# Patient Record
Sex: Male | Born: 1960 | Race: White | Hispanic: No | Marital: Married | Smoking: Never smoker
Health system: Southern US, Community
[De-identification: ages and names within clinical notes are randomized; demographics above are authoritative.]

## PROBLEM LIST (undated history)

## (undated) DIAGNOSIS — M810 Age-related osteoporosis without current pathological fracture: Secondary | ICD-10-CM

---

## 2000-10-10 ENCOUNTER — Encounter: Payer: Self-pay | Admitting: Emergency Medicine

## 2000-10-10 ENCOUNTER — Emergency Department (HOSPITAL_COMMUNITY): Admission: EM | Admit: 2000-10-10 | Discharge: 2000-10-10 | Payer: Self-pay | Admitting: Emergency Medicine

## 2004-05-23 ENCOUNTER — Emergency Department (HOSPITAL_COMMUNITY): Admission: EM | Admit: 2004-05-23 | Discharge: 2004-05-23 | Payer: Self-pay | Admitting: Emergency Medicine

## 2012-06-02 ENCOUNTER — Encounter (HOSPITAL_COMMUNITY): Payer: Self-pay | Admitting: Certified Registered Nurse Anesthetist

## 2012-06-02 ENCOUNTER — Emergency Department (HOSPITAL_COMMUNITY): Payer: Managed Care, Other (non HMO)

## 2012-06-02 ENCOUNTER — Inpatient Hospital Stay (HOSPITAL_COMMUNITY): Payer: Managed Care, Other (non HMO)

## 2012-06-02 ENCOUNTER — Inpatient Hospital Stay (HOSPITAL_COMMUNITY)
Admission: EM | Admit: 2012-06-02 | Discharge: 2012-06-04 | DRG: 493 | Disposition: A | Discharge status: Home / Self Care | Payer: Managed Care, Other (non HMO) | Source: Ambulatory Visit | Attending: Orthopedic Surgery | Admitting: Orthopedic Surgery

## 2012-06-02 ENCOUNTER — Encounter (HOSPITAL_COMMUNITY)
Admission: EM | Disposition: A | Discharge status: Home / Self Care | Payer: Self-pay | Source: Ambulatory Visit | Attending: Orthopedic Surgery

## 2012-06-02 ENCOUNTER — Emergency Department (HOSPITAL_COMMUNITY): Payer: Managed Care, Other (non HMO) | Admitting: Certified Registered Nurse Anesthetist

## 2012-06-02 ENCOUNTER — Encounter (HOSPITAL_COMMUNITY): Payer: Self-pay | Admitting: Cardiology

## 2012-06-02 DIAGNOSIS — S82209A Unspecified fracture of shaft of unspecified tibia, initial encounter for closed fracture: Secondary | ICD-10-CM | POA: Diagnosis present

## 2012-06-02 DIAGNOSIS — W010XXA Fall on same level from slipping, tripping and stumbling without subsequent striking against object, initial encounter: Secondary | ICD-10-CM | POA: Diagnosis present

## 2012-06-02 DIAGNOSIS — E1169 Type 2 diabetes mellitus with other specified complication: Secondary | ICD-10-CM | POA: Diagnosis present

## 2012-06-02 DIAGNOSIS — R55 Syncope and collapse: Secondary | ICD-10-CM | POA: Diagnosis present

## 2012-06-02 DIAGNOSIS — Z794 Long term (current) use of insulin: Secondary | ICD-10-CM

## 2012-06-02 DIAGNOSIS — S82409A Unspecified fracture of shaft of unspecified fibula, initial encounter for closed fracture: Secondary | ICD-10-CM

## 2012-06-02 DIAGNOSIS — S8253XA Displaced fracture of medial malleolus of unspecified tibia, initial encounter for closed fracture: Principal | ICD-10-CM | POA: Diagnosis present

## 2012-06-02 HISTORY — PX: TIBIA IM NAIL INSERTION: SHX2516

## 2012-06-02 LAB — POCT I-STAT, CHEM 8
Glucose, Bld: 156 mg/dL — ABNORMAL HIGH (ref 70–99)
HCT: 43 % (ref 39.0–52.0)
Hemoglobin: 14.6 g/dL (ref 13.0–17.0)
Potassium: 5.3 mEq/L — ABNORMAL HIGH (ref 3.5–5.1)

## 2012-06-02 LAB — CBC
MCV: 92.8 fL (ref 78.0–100.0)
Platelets: 223 10*3/uL (ref 150–400)
RDW: 14.1 % (ref 11.5–15.5)
WBC: 17.8 10*3/uL — ABNORMAL HIGH (ref 4.0–10.5)

## 2012-06-02 LAB — BASIC METABOLIC PANEL
Calcium: 9.4 mg/dL (ref 8.4–10.5)
Chloride: 100 mEq/L (ref 96–112)
Creatinine, Ser: 0.95 mg/dL (ref 0.50–1.35)
GFR calc Af Amer: >90 mL/min (ref >90)

## 2012-06-02 LAB — GLUCOSE, CAPILLARY
Glucose-Capillary: 125 mg/dL — ABNORMAL HIGH (ref 70–99)
Glucose-Capillary: 147 mg/dL — ABNORMAL HIGH (ref 70–99)
Glucose-Capillary: 204 mg/dL — ABNORMAL HIGH (ref 70–99)

## 2012-06-02 SURGERY — INSERTION, INTRAMEDULLARY ROD, TIBIA
Anesthesia: General | Site: Leg Lower | Laterality: Right | Wound class: Clean

## 2012-06-02 MED ORDER — CEFAZOLIN SODIUM 1-5 GM-% IV SOLN
1.0000 g | Freq: Four times a day (QID) | INTRAVENOUS | Status: AC
  Administered 2012-06-02 – 2012-06-03 (×3): 1 g via INTRAVENOUS
  Filled 2012-06-02 (×3): qty 50

## 2012-06-02 MED ORDER — OXYCODONE HCL 5 MG PO TABS
10.0000 mg | ORAL_TABLET | ORAL | Status: DC | PRN
  Administered 2012-06-03 – 2012-06-04 (×3): 10 mg via ORAL
  Filled 2012-06-02 (×3): qty 2

## 2012-06-02 MED ORDER — ZOLPIDEM TARTRATE 5 MG PO TABS: 5.0000 mg | ORAL_TABLET | Freq: Every evening | ORAL | Status: DC | PRN

## 2012-06-02 MED ORDER — ONDANSETRON HCL 4 MG PO TABS: 4.0000 mg | ORAL_TABLET | Freq: Four times a day (QID) | ORAL | Status: DC | PRN

## 2012-06-02 MED ORDER — LIDOCAINE HCL (CARDIAC) 20 MG/ML IV SOLN
INTRAVENOUS | Status: DC | PRN
  Administered 2012-06-02: 80 mg via INTRAVENOUS

## 2012-06-02 MED ORDER — ENOXAPARIN SODIUM 40 MG/0.4ML ~~LOC~~ SOLN
40.0000 mg | SUBCUTANEOUS | Status: DC
  Administered 2012-06-03 – 2012-06-04 (×2): 40 mg via SUBCUTANEOUS
  Filled 2012-06-02 (×3): qty 0.4

## 2012-06-02 MED ORDER — INSULIN ASPART 100 UNIT/ML ~~LOC~~ SOLN
0.0000 [IU] | SUBCUTANEOUS | Status: DC
  Administered 2012-06-02 – 2012-06-03 (×2): 2 [IU] via SUBCUTANEOUS

## 2012-06-02 MED ORDER — MIDAZOLAM HCL 5 MG/5ML IJ SOLN
INTRAMUSCULAR | Status: DC | PRN
  Administered 2012-06-02: 2 mg via INTRAVENOUS

## 2012-06-02 MED ORDER — INSULIN NPH (HUMAN) (ISOPHANE) 100 UNIT/ML ~~LOC~~ SUSP
12.0000 [IU] | Freq: Every day | SUBCUTANEOUS | Status: DC
  Administered 2012-06-03: 12 [IU] via SUBCUTANEOUS
  Filled 2012-06-02: qty 10

## 2012-06-02 MED ORDER — INSULIN ASPART 100 UNIT/ML ~~LOC~~ SOLN
5.0000 [IU] | Freq: Two times a day (BID) | SUBCUTANEOUS | Status: DC
Start: 2012-06-03 — End: 2012-06-03
  Administered 2012-06-03: 5 [IU] via SUBCUTANEOUS

## 2012-06-02 MED ORDER — MORPHINE SULFATE 4 MG/ML IJ SOLN
4.0000 mg | Freq: Once | INTRAMUSCULAR | Status: AC
  Administered 2012-06-02: 4 mg via INTRAVENOUS
  Filled 2012-06-02: qty 1

## 2012-06-02 MED ORDER — FENTANYL CITRATE 0.05 MG/ML IJ SOLN
INTRAMUSCULAR | Status: DC | PRN
  Administered 2012-06-02 (×2): 100 ug via INTRAVENOUS
  Administered 2012-06-02 (×3): 50 ug via INTRAVENOUS

## 2012-06-02 MED ORDER — NEOSTIGMINE METHYLSULFATE 1 MG/ML IJ SOLN
INTRAMUSCULAR | Status: DC | PRN
  Administered 2012-06-02: 5 mg via INTRAVENOUS

## 2012-06-02 MED ORDER — FLEET ENEMA 7-19 GM/118ML RE ENEM: 1.0000 | ENEMA | Freq: Once | RECTAL | Status: AC | PRN

## 2012-06-02 MED ORDER — GLYCOPYRROLATE 0.2 MG/ML IJ SOLN
INTRAMUSCULAR | Status: DC | PRN
  Administered 2012-06-02: .8 mg via INTRAVENOUS

## 2012-06-02 MED ORDER — CEFAZOLIN SODIUM-DEXTROSE 2-3 GM-% IV SOLR: 2.0000 g | Freq: Once | INTRAVENOUS | Status: DC

## 2012-06-02 MED ORDER — 0.9 % SODIUM CHLORIDE (POUR BTL) OPTIME
TOPICAL | Status: DC | PRN
  Administered 2012-06-02: 1000 mL

## 2012-06-02 MED ORDER — METHOCARBAMOL 500 MG PO TABS
500.0000 mg | ORAL_TABLET | Freq: Four times a day (QID) | ORAL | Status: DC | PRN
  Administered 2012-06-02 – 2012-06-04 (×3): 500 mg via ORAL
  Filled 2012-06-02 (×3): qty 1

## 2012-06-02 MED ORDER — METHOCARBAMOL 100 MG/ML IJ SOLN
500.0000 mg | Freq: Four times a day (QID) | INTRAVENOUS | Status: DC | PRN
  Filled 2012-06-02: qty 5

## 2012-06-02 MED ORDER — METOCLOPRAMIDE HCL 5 MG/ML IJ SOLN
5.0000 mg | Freq: Three times a day (TID) | INTRAMUSCULAR | Status: DC | PRN
Start: 2012-06-02 — End: 2012-06-04

## 2012-06-02 MED ORDER — BUPIVACAINE HCL (PF) 0.25 % IJ SOLN
INTRAMUSCULAR | Status: AC
  Filled 2012-06-02: qty 30

## 2012-06-02 MED ORDER — BUPIVACAINE HCL 0.25 % IJ SOLN
INTRAMUSCULAR | Status: DC | PRN
  Administered 2012-06-02: 10 mL

## 2012-06-02 MED ORDER — TETANUS-DIPHTH-ACELL PERTUSSIS 5-2.5-18.5 LF-MCG/0.5 IM SUSP
0.5000 mL | Freq: Once | INTRAMUSCULAR | Status: AC
  Administered 2012-06-02: 0.5 mL via INTRAMUSCULAR
  Filled 2012-06-02: qty 0.5

## 2012-06-02 MED ORDER — ONDANSETRON HCL 4 MG/2ML IJ SOLN: 4.0000 mg | Freq: Four times a day (QID) | INTRAMUSCULAR | Status: DC | PRN

## 2012-06-02 MED ORDER — ONDANSETRON HCL 4 MG/2ML IJ SOLN
4.0000 mg | INTRAMUSCULAR | Status: DC | PRN
  Administered 2012-06-02: 4 mg via INTRAVENOUS
  Filled 2012-06-02: qty 2

## 2012-06-02 MED ORDER — METOCLOPRAMIDE HCL 5 MG PO TABS
5.0000 mg | ORAL_TABLET | Freq: Three times a day (TID) | ORAL | Status: DC | PRN
Start: 2012-06-02 — End: 2012-06-04
  Filled 2012-06-02: qty 2

## 2012-06-02 MED ORDER — ACETAMINOPHEN 10 MG/ML IV SOLN
INTRAVENOUS | Status: DC | PRN
  Administered 2012-06-02: 1000 mg via INTRAVENOUS

## 2012-06-02 MED ORDER — FENTANYL CITRATE 0.05 MG/ML IJ SOLN: INTRAMUSCULAR | Status: DC | PRN

## 2012-06-02 MED ORDER — LACTATED RINGERS IV SOLN
INTRAVENOUS | Status: DC | PRN
  Administered 2012-06-02 (×3): via INTRAVENOUS

## 2012-06-02 MED ORDER — SUCCINYLCHOLINE CHLORIDE 20 MG/ML IJ SOLN
INTRAMUSCULAR | Status: DC | PRN
  Administered 2012-06-02: 120 mg via INTRAVENOUS

## 2012-06-02 MED ORDER — INSULIN REGULAR HUMAN 100 UNIT/ML IJ SOLN: 5.0000 [IU] | Freq: Two times a day (BID) | INTRAMUSCULAR | Status: DC

## 2012-06-02 MED ORDER — DEXTROSE 50 % IV SOLN
INTRAVENOUS | Status: AC
  Administered 2012-06-02: 13:00:00
  Filled 2012-06-02: qty 50

## 2012-06-02 MED ORDER — MORPHINE SULFATE 4 MG/ML IJ SOLN
INTRAMUSCULAR | Status: AC
  Filled 2012-06-02: qty 1

## 2012-06-02 MED ORDER — MORPHINE SULFATE 2 MG/ML IJ SOLN
1.0000 mg | INTRAMUSCULAR | Status: DC | PRN
  Administered 2012-06-02 – 2012-06-03 (×2): 1 mg via INTRAVENOUS
  Filled 2012-06-02 (×2): qty 1

## 2012-06-02 MED ORDER — ATORVASTATIN CALCIUM 10 MG PO TABS
10.0000 mg | ORAL_TABLET | Freq: Every day | ORAL | Status: DC
  Administered 2012-06-03 (×2): 10 mg via ORAL
  Filled 2012-06-02 (×4): qty 1

## 2012-06-02 MED ORDER — ONDANSETRON HCL 4 MG/2ML IJ SOLN
INTRAMUSCULAR | Status: DC | PRN
  Administered 2012-06-02 (×2): 4 mg via INTRAVENOUS

## 2012-06-02 MED ORDER — DOCUSATE SODIUM 100 MG PO CAPS
100.0000 mg | ORAL_CAPSULE | Freq: Two times a day (BID) | ORAL | Status: DC
  Administered 2012-06-03 – 2012-06-04 (×3): 100 mg via ORAL
  Filled 2012-06-02 (×5): qty 1

## 2012-06-02 MED ORDER — MORPHINE SULFATE 4 MG/ML IJ SOLN
4.0000 mg | Freq: Once | INTRAMUSCULAR | Status: AC
  Administered 2012-06-02: 4 mg via INTRAVENOUS

## 2012-06-02 MED ORDER — INSULIN NPH (HUMAN) (ISOPHANE) 100 UNIT/ML ~~LOC~~ SUSP
7.0000 [IU] | Freq: Every day | SUBCUTANEOUS | Status: DC
  Filled 2012-06-02: qty 10

## 2012-06-02 MED ORDER — ACETAMINOPHEN 10 MG/ML IV SOLN
1000.0000 mg | Freq: Four times a day (QID) | INTRAVENOUS | Status: AC
  Administered 2012-06-02 – 2012-06-03 (×4): 1000 mg via INTRAVENOUS
  Filled 2012-06-02 (×4): qty 100

## 2012-06-02 MED ORDER — HYDROMORPHONE HCL PF 1 MG/ML IJ SOLN
0.2500 mg | INTRAMUSCULAR | Status: DC | PRN
  Administered 2012-06-02 (×4): 0.5 mg via INTRAVENOUS

## 2012-06-02 MED ORDER — INSULIN NPH (HUMAN) (ISOPHANE) 100 UNIT/ML ~~LOC~~ SUSP: 7.0000 [IU] | Freq: Two times a day (BID) | SUBCUTANEOUS | Status: DC

## 2012-06-02 MED ORDER — HYDROMORPHONE HCL PF 1 MG/ML IJ SOLN
INTRAMUSCULAR | Status: AC
  Filled 2012-06-02: qty 1

## 2012-06-02 MED ORDER — PROPOFOL 10 MG/ML IV EMUL
INTRAVENOUS | Status: DC | PRN
  Administered 2012-06-02: 200 mg via INTRAVENOUS

## 2012-06-02 MED ORDER — ROCURONIUM BROMIDE 100 MG/10ML IV SOLN
INTRAVENOUS | Status: DC | PRN
  Administered 2012-06-02: 50 mg via INTRAVENOUS

## 2012-06-02 SURGICAL SUPPLY — 68 items
BANDAGE ELASTIC 4 VELCRO ST LF (GAUZE/BANDAGES/DRESSINGS) ×2 IMPLANT
BANDAGE ELASTIC 6 VELCRO ST LF (GAUZE/BANDAGES/DRESSINGS) ×2 IMPLANT
BANDAGE ESMARK 6X9 LF (GAUZE/BANDAGES/DRESSINGS) IMPLANT
BANDAGE GAUZE ELAST BULKY 4 IN (GAUZE/BANDAGES/DRESSINGS) ×2 IMPLANT
BIT DRILL 3.8X6 NS (BIT) ×2 IMPLANT
BIT DRILL 4.4 NS (BIT) ×2 IMPLANT
BNDG COHESIVE 4X5 TAN STRL (GAUZE/BANDAGES/DRESSINGS) ×2 IMPLANT
BNDG ESMARK 6X9 LF (GAUZE/BANDAGES/DRESSINGS)
CLOTH BEACON ORANGE TIMEOUT ST (SAFETY) ×2 IMPLANT
COVER SURGICAL LIGHT HANDLE (MISCELLANEOUS) ×2 IMPLANT
CUFF TOURNIQUET SINGLE 34IN LL (TOURNIQUET CUFF) IMPLANT
DRAPE C-ARM 42X72 X-RAY (DRAPES) ×2 IMPLANT
DRAPE EXTREMITY T 121X128X90 (DRAPE) ×2 IMPLANT
DRAPE INCISE IOBAN 66X45 STRL (DRAPES) ×2 IMPLANT
DRAPE ORTHO SPLIT 77X108 STRL (DRAPES) ×2
DRAPE PROXIMA HALF (DRAPES) ×4 IMPLANT
DRAPE SURG ORHT 6 SPLT 77X108 (DRAPES) ×2 IMPLANT
DRAPE U-SHAPE 47X51 STRL (DRAPES) ×2 IMPLANT
DRSG ADAPTIC 3X8 NADH LF (GAUZE/BANDAGES/DRESSINGS) ×2 IMPLANT
ELECT REM PT RETURN 9FT ADLT (ELECTROSURGICAL) ×2
ELECTRODE REM PT RTRN 9FT ADLT (ELECTROSURGICAL) ×1 IMPLANT
END CAP UNIVERSAL FLUSH (Trauma) ×2 IMPLANT
EVACUATOR 1/8 PVC DRAIN (DRAIN) IMPLANT
GLOVE BIO SURGEON STRL SZ 6.5 (GLOVE) ×4 IMPLANT
GLOVE BIOGEL PI IND STRL 6.5 (GLOVE) ×2 IMPLANT
GLOVE BIOGEL PI IND STRL 8.5 (GLOVE) ×1 IMPLANT
GLOVE BIOGEL PI INDICATOR 6.5 (GLOVE) ×2
GLOVE BIOGEL PI INDICATOR 8.5 (GLOVE) ×1
GLOVE ECLIPSE 6.0 STRL STRAW (GLOVE) ×4 IMPLANT
GLOVE ECLIPSE 8.5 STRL (GLOVE) ×2 IMPLANT
GOWN STRL NON-REIN LRG LVL3 (GOWN DISPOSABLE) ×4 IMPLANT
K-WIRE ACE 1.6X6 (WIRE) ×6
KIT BASIN OR (CUSTOM PROCEDURE TRAY) ×2 IMPLANT
KIT ROOM TURNOVER OR (KITS) ×2 IMPLANT
KWIRE ACE 1.6X6 (WIRE) ×3 IMPLANT
NAIL TIBIAL 9MMX34.5CM (Nail) ×2 IMPLANT
NEEDLE HYPO 25GX1X1/2 BEV (NEEDLE) ×2 IMPLANT
PACK ORTHO EXTREMITY (CUSTOM PROCEDURE TRAY) ×2 IMPLANT
PAD ARMBOARD 7.5X6 YLW CONV (MISCELLANEOUS) ×4 IMPLANT
PADDING CAST ABS 4INX4YD NS (CAST SUPPLIES) ×1
PADDING CAST ABS COTTON 4X4 ST (CAST SUPPLIES) ×1 IMPLANT
PIN GUIDE ACE (PIN) ×2 IMPLANT
SCREW ACE CAP BN (Screw) ×1 IMPLANT
SCREW ACECAP 38MM (Screw) ×2 IMPLANT
SCREW ACECAP 40MM (Screw) ×2 IMPLANT
SCREW BN OBLQ FT 54X4.5XST 2 (Screw) ×1 IMPLANT
SCREW CANN PART THRD 4.0X55 (Screw) ×2 IMPLANT
SCREW CORTICAL 5.5 35MM (Screw) ×2 IMPLANT
SCREW LAG  RD HEAD 4.0 48 LTH (Screw) ×2 IMPLANT
SCREW LAG  RD HEAD 4.0 50 LTH (Screw) ×1 IMPLANT
SCREW LAG RD HEAD 4.0 50 LTH (Screw) ×1 IMPLANT
SPLINT PLASTER CAST XFAST 5X30 (CAST SUPPLIES) ×1 IMPLANT
SPLINT PLASTER XFAST SET 5X30 (CAST SUPPLIES) ×1
SPONGE GAUZE 4X4 12PLY (GAUZE/BANDAGES/DRESSINGS) ×2 IMPLANT
STAPLER VISISTAT 35W (STAPLE) ×2 IMPLANT
STOCKINETTE TUBULAR 6 INCH (GAUZE/BANDAGES/DRESSINGS) ×2 IMPLANT
SUT PROLENE 3 0 PS 2 (SUTURE) IMPLANT
SUT VIC AB 0 CT1 27 (SUTURE)
SUT VIC AB 0 CT1 27XBRD ANBCTR (SUTURE) IMPLANT
SUT VIC AB 1 CT1 27 (SUTURE) ×1
SUT VIC AB 1 CT1 27XBRD ANBCTR (SUTURE) ×1 IMPLANT
SUT VIC AB 2-0 CT1 27 (SUTURE)
SUT VIC AB 2-0 CT1 TAPERPNT 27 (SUTURE) IMPLANT
SYR CONTROL 10ML LL (SYRINGE) ×2 IMPLANT
TOWEL OR 17X24 6PK STRL BLUE (TOWEL DISPOSABLE) ×2 IMPLANT
TOWEL OR 17X26 10 PK STRL BLUE (TOWEL DISPOSABLE) ×2 IMPLANT
TUBE CONNECTING 12X1/4 (SUCTIONS) ×2 IMPLANT
YANKAUER SUCT BULB TIP NO VENT (SUCTIONS) ×2 IMPLANT

## 2012-06-02 NOTE — Anesthesia Preprocedure Evaluation (Addendum)
Anesthesia Evaluation  Patient identified by MRN, date of birth, ID band Patient awake    Reviewed: Allergy & Precautions, H&P , NPO status , Patient's Chart, lab work & pertinent test results  Airway Mallampati: II TM Distance: >3 FB Neck ROM: Full    Dental  (+) Dental Advisory Given   Pulmonary neg pulmonary ROS,  breath sounds clear to auscultation        Cardiovascular negative cardio ROS  Rhythm:Regular Rate:Normal     Neuro/Psych    GI/Hepatic Neg liver ROS,   Endo/Other  Diabetes mellitus-, Well Controlled, Type 1, Insulin Dependent  Renal/GU negative Renal ROS     Musculoskeletal   Abdominal   Peds  Hematology   Anesthesia Other Findings   Reproductive/Obstetrics                         Anesthesia Physical Anesthesia Plan  ASA: III  Anesthesia Plan: General   Post-op Pain Management:    Induction: Intravenous  Airway Management Planned: Oral ETT  Additional Equipment:   Intra-op Plan:   Post-operative Plan: Extubation in OR  Informed Consent: I have reviewed the patients History and Physical, chart, labs and discussed the procedure including the risks, benefits and alternatives for the proposed anesthesia with the patient or authorized representative who has indicated his/her understanding and acceptance.     Plan Discussed with: CRNA, Surgeon and Anesthesiologist  Anesthesia Plan Comments:        Anesthesia Quick Evaluation

## 2012-06-02 NOTE — Transfer of Care (Signed)
Immediate Anesthesia Transfer of Care Note  Patient: Eddie Vang  Procedure(s) Performed: Procedure(s) (LRB): INTRAMEDULLARY (IM) NAIL TIBIAL (Right)  Patient Location: PACU  Anesthesia Type: General  Level of Consciousness: awake, oriented and patient cooperative  Airway & Oxygen Therapy: Patient Spontanous Breathing and Patient connected to face mask oxygen  Post-op Assessment: Report given to PACU RN, Post -op Vital signs reviewed and stable and Patient moving all extremities X 4  Post vital signs: Reviewed and stable  Complications: No apparent anesthesia complications

## 2012-06-02 NOTE — ED Provider Notes (Signed)
History     CSN: [id number]  Arrival date & time 06/02/12  1156   First MD Initiated Contact with Patient 06/02/12 1158      Chief Complaint  Patient presents with  . Ankle Injury    (Consider location/radiation/quality/duration/timing/severity/associated sxs/prior treatment) HPI Comments: Patient with a history of type 1 diabetes presents emergency department with a hypoglycemic syncopal event causing right tib-fib/ankle injury.  Incident occurred about 30-45 minutes ago and patient arrived via EMS. He was given one amp of D50 when his CBG was found to be 50.  Patient also given 250 fentanyl in route for pain.  Insulin dosing is 5 reg &12 nph AM,  7NPH at PM.  Patient denies hitting his head, vision change, headache, fever, night sweats, chills, palpitations, chest pain.  Tetanus status not up to date. .    Patient is a 51 y.o. male presenting with lower extremity injury. The history is provided by the patient.  Ankle Injury Associated symptoms include nausea. Pertinent negatives include no abdominal pain, chest pain, chills, congestion, diaphoresis, fever, headaches, numbness or weakness.    Past Medical History  Diagnosis Date  . Diabetes mellitus     History reviewed. No pertinent past surgical history.  History reviewed. No pertinent family history.  History  Substance Use Topics  . Smoking status: Not on file  . Smokeless tobacco: Not on file  . Alcohol Use:       Review of Systems  Constitutional: Negative for fever, chills, diaphoresis and appetite change.  HENT: Negative for congestion.   Eyes: Negative for visual disturbance.  Respiratory: Negative for shortness of breath.   Cardiovascular: Negative for chest pain and leg swelling.  Gastrointestinal: Positive for nausea. Negative for abdominal pain.  Genitourinary: Negative for dysuria, urgency and frequency.  Skin: Positive for color change, pallor and wound.  Neurological: Negative for dizziness, syncope,  weakness, light-headedness, numbness and headaches.  Psychiatric/Behavioral: Negative for confusion.  All other systems reviewed and are negative.    Allergies  Review of patient's allergies indicates no known allergies.  Home Medications   Current Outpatient Rx  Name Route Sig Dispense Refill  . ATORVASTATIN CALCIUM 10 MG PO TABS Oral Take 10 mg by mouth at bedtime.    . INSULIN ISOPHANE HUMAN 100 UNIT/ML Lambert SUSP Subcutaneous Inject 7-12 Units into the skin 2 (two) times daily before a meal. Inject 12 units in the morning and 7 units at night.    . INSULIN REGULAR HUMAN 100 UNIT/ML IJ SOLN Subcutaneous Inject 5 Units into the skin 2 (two) times daily before a meal.      BP 120/59  Pulse 70  Temp 97.6 F (36.4 C) (Oral)  Resp 18  SpO2 98%  Physical Exam  Nursing note and vitals reviewed. Constitutional: He is oriented to person, place, and time. He appears well-developed and well-nourished. No distress.       Normal Vitals  HENT:  Head: Normocephalic and atraumatic.  Eyes: Conjunctivae and EOM are normal.  Neck: Normal range of motion.  Cardiovascular:       Intact distal pulses  Pulmonary/Chest: Effort normal.  Musculoskeletal: Normal range of motion.       Right distal tib/fib with obvious deformity. No ttp over lateral malleolus. Pt able to move toes without difficulty.   Neurological: He is alert and oriented to person, place, and time.       Intact distal sensation  Skin: Skin is warm and dry. Abrasion and bruising noted. No  rash noted. He is not diaphoretic.       Bruising over medial malleolus, small abrasion over anterior shin, does not appear to be open, no laceration.   Psychiatric: He has a normal mood and affect. His behavior is normal.    ED Course  Procedures (including critical care time)  Labs Reviewed  GLUCOSE, CAPILLARY - Abnormal; Notable for the following:    Glucose-Capillary 138 (*)     All other components within normal limits  URINALYSIS,  ROUTINE W REFLEX MICROSCOPIC  CBC   Dg Tibia/fibula Right  06/02/2012  *RADIOLOGY REPORT*  Clinical Data: History of trauma from a fall.  RIGHT TIBIA AND FIBULA - 2 VIEW  Comparison: No priors.  Findings: There are comminuted fractures of the distal third of the tibial and fibular diaphyses.  Both fractures are angulated approximately 20 degrees posteriorly.  Fracture fragments are mildly distracted.  Overlying soft tissues are extremely swollen.  IMPRESSION: 1.  Comminuted distracted and angulated fractures of the distal third of the right tibial and fibular diaphyses with overlying soft tissue swelling, as above.  Original Report Authenticated By: Florencia Reasons, M.D.     No diagnosis found.  istat chm 8 w k+ of 5.3, ? Hemolysis will redraw.   MDM  Distal Tib/fib fractures w 20 degree angulation, closed but skin tenting present. Pain managed in ED. Last CBG 128. Orthopedics to see pt in ED and admit for surgery. Last meal at 10:00 AM (small bowl of cereal)  The patient appears reasonably stabilized for admission considering the current resources, flow, and capabilities available in the ED at this time, and I doubt any other [facility] requiring further screening and/or treatment in the ED prior to admission.         Jaci Carrel, [place] 06/02/12 1451

## 2012-06-02 NOTE — ED Notes (Signed)
Pt to department from home via EMS- reports that he was carrying som food and felt like his CBG was low. Stated that he tried to turn and lose his balance. Pt with obvious injury to the right ankle. CBG was initially 50 and given 1 amp of d50. Also given 250 of fent over the past hour. Bp- 126/70 Hr-65.

## 2012-06-02 NOTE — ED Notes (Signed)
Pt knows we need a urine sample but is unable to give one at this time

## 2012-06-02 NOTE — Preoperative (Signed)
Beta Blockers   Reason not to administer Beta Blockers:Not Applicable 

## 2012-06-02 NOTE — Brief Op Note (Signed)
06/02/2012  5:45 PM  PATIENT:  Eddie Vang  51 y.o. male  PRE-OPERATIVE DIAGNOSIS:  right tibia/fibula fracture  POST-OPERATIVE DIAGNOSIS:  right tibia/fibula fracture  PROCEDURE:  Procedure(s) (LRB): INTRAMEDULLARY (IM) NAIL TIBIAL (Right)  SURGEON:  Surgeon(s) and Role:    * Venita Lick, MD - Primary  PHYSICIAN ASSISTANT:   ASSISTANTS: Norval Gable   ANESTHESIA:   general  EBL:  Total I/O In: 2000 [I.V.:2000] Out: 150 [Blood:150]  BLOOD ADMINISTERED:none  DRAINS: none   LOCAL MEDICATIONS USED:  MARCAINE     SPECIMEN:  No Specimen  DISPOSITION OF SPECIMEN:  N/A  COUNTS:  YES  TOURNIQUET:  * No tourniquets in log *  DICTATION: .Other Dictation: Dictation Number [id number]  PLAN OF CARE: Admit to inpatient   PATIENT DISPOSITION:  PACU - hemodynamically stable.

## 2012-06-02 NOTE — Anesthesia Procedure Notes (Signed)
Procedure Name: Intubation Date/Time: 06/02/2012 3:32 PM Performed by: Sherie Don Pre-anesthesia Checklist: Patient identified, Emergency Drugs available, Suction available, Patient being monitored and Timeout performed Patient Re-evaluated:Patient Re-evaluated prior to inductionOxygen Delivery Method: Circle system utilized Preoxygenation: Pre-oxygenation with 100% oxygen Intubation Type: Cricoid Pressure applied and Rapid sequence Laryngoscope Size: Mac and 3 Grade View: Grade II Tube type: Oral Tube size: 7.5 mm Number of attempts: 1 Airway Equipment and Method: Stylet Placement Confirmation: ETT inserted through vocal cords under direct vision,  CO2 detector and breath sounds checked- equal and bilateral Secured at: 23 cm Dental Injury: Teeth and Oropharynx as per pre-operative assessment

## 2012-06-02 NOTE — Progress Notes (Signed)
06/02/12 1449  OTHER  CSW Follow Up Status No follow-up needed     Unit based LCSW to be consulted if psychosocial needs arise. Pt is un-funded therefore unit based CM to be contacted for RX and DME needs.   Dionne Milo MSW [facility] Emergency Dept. Weekend/Social Worker [phone]

## 2012-06-02 NOTE — ED Notes (Signed)
Ortho surgeon in to see patient.  Wife remains at bedside.  Meal tray given to wife.

## 2012-06-02 NOTE — Progress Notes (Signed)
06/02/12 1448  Discharge Planning  Type of Residence Private residence  Living Arrangements Spouse/significant other  Home Care Services No  Support Systems Spouse/significant other  Do you have any problems obtaining your medications? (No payor source noted)  Family/patient expects to be discharged to: Private residence  Once you are discharged, how will you get to your follow-up appointment? Family  Expected Discharge Date 06/04/12  Case Management Consult Needed Yes (Comment)  Social Work Consult Needed No

## 2012-06-02 NOTE — H&P (Signed)
No primary provider on file. Chief Complaint: Right tib/Fib fracture History: Pleasant gentlemen who fell this AM as a result of hypoglycemia.  Patient with history of DM no other medical issues.  Presents to ER with defomrity of right LE.  Question open injury given abrasion over the fx site.  Concern for in-out pin hole injury (open fx)  Past Medical History  Diagnosis Date  . Diabetes mellitus     No Known Allergies  No current facility-administered medications on file prior to encounter.   Current Outpatient Prescriptions on File Prior to Encounter  Medication Sig Dispense Refill  . atorvastatin (LIPITOR) 10 MG tablet Take 10 mg by mouth at bedtime.      . insulin NPH (HUMULIN N,NOVOLIN N) 100 UNIT/ML injection Inject 7-12 Units into the skin 2 (two) times daily before a meal. Inject 12 units in the morning and 7 units at night.      . insulin regular (NOVOLIN R,HUMULIN R) 100 units/mL injection Inject 5 Units into the skin 2 (two) times daily before a meal.        Physical Exam: Filed Vitals:   06/02/12 1203  BP: 120/59  Pulse: 70  Temp: 97.6 F (36.4 C)  Resp: 18   No sob/cp A+O X3 2+ DP/PT pulses in LE Sensation to LT intact bilaterally Compartments soft/NT Small abrasion over the distal tib (fx site) EHL/TA/GA intact abd soft/NT UE -FROM, no pain/crepitus with ROM of all joints Image: Dg Chest 2 View  06/02/2012  *RADIOLOGY REPORT*  Clinical Data: Tib-fib fracture, preop  CHEST - 2 VIEW  Comparison: None.  Findings: Lungs clear.  Heart size and pulmonary vascularity normal.  No effusion.  Visualized bones unremarkable.  IMPRESSION: No acute disease  Original Report Authenticated By: Osa Craver, M.D.   Dg Tibia/fibula Right  06/02/2012  *RADIOLOGY REPORT*  Clinical Data: History of trauma from a fall.  RIGHT TIBIA AND FIBULA - 2 VIEW  Comparison: No priors.  Findings: There are comminuted fractures of the distal third of the tibial and fibular diaphyses.   Both fractures are angulated approximately 20 degrees posteriorly.  Fracture fragments are mildly distracted.  Overlying soft tissues are extremely swollen.  IMPRESSION: 1.  Comminuted distracted and angulated fractures of the distal third of the right tibial and fibular diaphyses with overlying soft tissue swelling, as above.  Original Report Authenticated By: Florencia Reasons, M.D.    A/P:  Right tib/fib fracture s/p fall with right tib/fib fracture Patient with hypoglycemic event causing a fall.   Abrasion/shallow wound over skin.  Concern about open injury versus risk to be open.  As a result will declare case an emergency.   Plan on IM nail fixation  Reviewed risks/benefits with patient and wife - all questions addressed.

## 2012-06-02 NOTE — Anesthesia Postprocedure Evaluation (Signed)
  Anesthesia Post-op Note  Patient: Eddie Vang  Procedure(s) Performed: Procedure(s) (LRB): INTRAMEDULLARY (IM) NAIL TIBIAL (Right)  Patient Location: PACU  Anesthesia Type: General  Level of Consciousness: awake  Airway and Oxygen Therapy: Patient Spontanous Breathing  Post-op Pain: mild  Post-op Assessment: Post-op Vital signs reviewed  Post-op Vital Signs: Reviewed  Complications: No apparent anesthesia complications

## 2012-06-02 NOTE — Op Note (Signed)
NAMEBERNHARDT, RIEMENSCHNEIDER          ACCOUNT NO.:  [id number]  MEDICAL RECORD NO.:  [id number]  LOCATION:  5N24C                        FACILITY:  [facility]  PHYSICIAN:  Alvy Beal, MD    DATE OF BIRTH:  03/02/1961  DATE OF PROCEDURE:  06/02/2012 DATE OF DISCHARGE:                              OPERATIVE REPORT   PREOPERATIVE DIAGNOSIS:  Tib-fib fracture, comminuted.  POSTOPERATIVE DIAGNOSES:  Tib-fib fracture, comminuted plus posterior malleolus fracture subluxation of the ankle on the right side.  OPERATIVE PROCEDURES: 1. IM nail fixation of tibia fracture. 2. Open reduction, internal fixation of posterior malleolus fracture.  INSTRUMENTATION SYSTEM USED:  Biomet tibial nail size 9 x 34 with appropriate static locking screw; 1 proximal, two distal, and then cannulated screw fixation x2 of the posterior malleolus fracture.  FIRST ASSISTANT:  Norval Gable, PA  COMPLICATIONS:  None.  CONDITION:  Stable.  HISTORY:  This is a very pleasant gentleman, who had a hypoglycemic event this morning, fell and injured his right lower extremity.  He presented to the emergency room with gross deformity and inability to ambulate.  As a result, Orthopedic consultation was requested. Initially, the fracture fragment appeared to be mostly a comminuted distal 3rd tibia fracture.  As a result, he was consented to the aforementioned procedure.  It should be noted that all appropriate risks, benefits, and alternatives of surgery were discussed with the patient and consent was obtained.  OPERATIVE NOTE:  The patient was brought to the operating room, placed supine on the operating table.  After successful induction of general anesthesia and endotracheal intubation, a tourniquet was placed on the right proximal thigh for possible insufflation during the case.  The right lower extremity was marked appropriately and then prepped and draped in the standard fashion.  Time-out was done to confirm  patient, procedure, and all other pertinent important data was discussed.  Under AP guidance, an incision site was mapped out on the proximal tibia.  An incision was made starting at the inferior aspect of the patella and proceeding down to the tibial tubercle.  Sharp dissection was carried out down to the patellar tendon.  The retinaculum was incised and a medial parapatellar arthrotomy and medial patella dissection was carried out.  The patellar tendon was then retracted laterally and the midpoint of the tibia was identified.  Once this was properly identified in the AP and lateral planes, an awl was used to advance through the cortex and into the canal.  A guide pin was then advanced through the awl and the awl was removed.  The fracture was held in the reduced position, and the guide pin was advanced down the tibia across the fracture site and into the distal fragment.  Once this was properly done, I then used an in- cutting reamer and then a size 10 reamer.  I did get excellent chatter. Once the reamer was completed, we used a 34 x 9 nail.  The nail was advanced down to the appropriate depth.  As the nail was being advanced down and once guide into the distal fragment, the posterior malleolus fragment appeared to  displace.  The fracture itself was well reduced of the distal 3rd tibia with  the posterior malleolar fragment had displaced.  This had caused some subluxation of the tibiotalar joint. As a result of this, we elected to perform an ORIF of the posterior malleolar fragment.  It was large in size, and there was joint instability.  Once the nail was down, a proximal static locking screw was then placed through a separate stab incision.  I advanced this appropriate size screw and got excellent bony purchase.  Distally I placed 2 medial to lateral locking screws and got excellent purchase. Once the nail was in place and locked proximally and distally, I then in the AP plane,  advanced 2 guide pins through separate stab incisions, 1 medial and 1 lateral to the nail across the fracture fragment and into the posterior malleolar fragment.  I confirmed satisfactory reduction and position of the guide pins in the AP and lateral mortise views. Once this was done, I drilled the near cortex and placed the appropriate size self-drilling partially-threaded screws.  Once they were done and the posterior malleolus fragment had reduced, the articular step-off was reduced and the fracture was aligned.  There was no further subluxation of the ankle joint.  At this point, all the wounds were irrigated copiously with normal saline.  The patellar retinaculum was reapproximated with interrupted #1 Vicryl sutures, and then a 2-0 Vicryl sutures stitch for the subcutaneous.  All wounds were irrigated and closed with staples.  Bulky dry dressing and posterior splint with side struts were applied.  At this point, the patient was extubated, transferred to PACU without incident.  At the end of the case, all needle, sponge counts were correct.     Alvy Beal, MD     DDB/MEDQ  D:  06/02/2012  T:  06/02/2012  Job:  [id number]

## 2012-06-03 ENCOUNTER — Encounter (HOSPITAL_COMMUNITY): Payer: Self-pay | Admitting: Orthopedic Surgery

## 2012-06-03 ENCOUNTER — Inpatient Hospital Stay (HOSPITAL_COMMUNITY): Payer: Managed Care, Other (non HMO)

## 2012-06-03 LAB — GLUCOSE, CAPILLARY
Glucose-Capillary: 204 mg/dL — ABNORMAL HIGH (ref 70–99)
Glucose-Capillary: 180 mg/dL — ABNORMAL HIGH (ref 70–99)
Glucose-Capillary: 198 mg/dL — ABNORMAL HIGH (ref 70–99)
Glucose-Capillary: 184 mg/dL — ABNORMAL HIGH (ref 70–99)
Glucose-Capillary: 220 mg/dL — ABNORMAL HIGH (ref 70–99)
Glucose-Capillary: 96 mg/dL (ref 70–99)
Glucose-Capillary: 115 mg/dL — ABNORMAL HIGH (ref 70–99)

## 2012-06-03 LAB — HEMOGLOBIN A1C: Hgb A1c MFr Bld: 5.3 % (ref <5.7)

## 2012-06-03 MED ORDER — SODIUM CHLORIDE 0.9 % IV SOLN
INTRAVENOUS | Status: DC
  Administered 2012-06-03: 20 mL/h via INTRAVENOUS

## 2012-06-03 MED ORDER — DEXTROSE 50 % IV SOLN
25.0000 mL | INTRAVENOUS | Status: DC | PRN
  Administered 2012-06-04: 17 mL via INTRAVENOUS
  Filled 2012-06-03: qty 50

## 2012-06-03 MED ORDER — INSULIN REGULAR BOLUS VIA INFUSION
0.0000 [IU] | Freq: Three times a day (TID) | INTRAVENOUS | Status: DC
  Administered 2012-06-03: 4 [IU] via INTRAVENOUS
  Administered 2012-06-03: 0.4 [IU] via INTRAVENOUS
  Administered 2012-06-03: 1.9 [IU] via INTRAVENOUS
  Administered 2012-06-04: 4 [IU] via INTRAVENOUS
  Administered 2012-06-04: 2.6 [IU] via INTRAVENOUS
  Filled 2012-06-03: qty 10

## 2012-06-03 MED ORDER — DEXTROSE-NACL 5-0.45 % IV SOLN
INTRAVENOUS | Status: DC
  Administered 2012-06-03: 18:00:00 via INTRAVENOUS

## 2012-06-03 MED ORDER — SODIUM CHLORIDE 0.9 % IV SOLN
INTRAVENOUS | Status: DC
  Administered 2012-06-03: 15:00:00 via INTRAVENOUS
  Filled 2012-06-03 (×2): qty 1

## 2012-06-03 MED FILL — Hydromorphone HCl Inj 1 MG/ML: INTRAMUSCULAR | Qty: 1 | Status: AC

## 2012-06-03 NOTE — Progress Notes (Signed)
CARE MANAGEMENT NOTE 06/03/2012  Patient:  Eddie Vang, Eddie Vang   Account Number:  [id number]  Date Initiated:  06/03/2012  Documentation initiated by:  Vance Peper  Subjective/Objective Assessment:   51 yr old male s/p ORIF of posterior malleoulus fracture     Action/Plan:   CM will follow, patient has to be seen by therapy   Anticipated DC Date:     Anticipated DC Plan:           Choice offered to / List presented to:             Status of service:  In process, will continue to follow Medicare Important Message given?   (If response is "NO", the following Medicare IM given date fields will be blank) Date Medicare IM given:   Date Additional Medicare IM given:    Discharge Disposition:    Per UR Regulation:    If discussed at Long Length of Stay Meetings, dates discussed:    Comments:

## 2012-06-03 NOTE — ED Provider Notes (Signed)
Medical screening examination/treatment/procedure(s) were performed by non-physician practitioner and as supervising physician I was immediately available for consultation/collaboration.   Gwyneth Sprout, MD 06/03/12 [phone]

## 2012-06-03 NOTE — Progress Notes (Signed)
Inpatient Diabetes Program Recommendations  AACE/ADA: New Consensus Statement on Inpatient Glycemic Control (2009)  Target Ranges:  Prepandial:   less than 140 mg/dL      Peak postprandial:   less than 180 mg/dL (1-2 hours)      Critically ill patients:  140 - 180 mg/dL    Inpatient Diabetes Program Recommendations Insulin - IV drip/GlucoStabilizer: Recommend insulin gtt for this patient   Diabetes Coordinator spoke with patient concerning his glucose management.  The patient keeps his glucose tightly controlled and reports his A1C is usually 5-5.5 range.  He is not satisfied with the range of his CBGs here in the hospital.  He is not eating because he does not want his CBGs to rise.  He wants tighter control. The best control for this T-1 patient would be an insulin gtt per the GlucoStabilizer.  He will be able to eat and the carbs covered using the [employer] program.  He is in agreement with this plan.  Telephone orders received from Dr. Shon Baton' PA Norval Gable.   Will follow during this admission.  Thank you  Piedad Climes [facility] Inpatient Diabetes Coordinator [phone]

## 2012-06-03 NOTE — Progress Notes (Signed)
UR COMPLETED  

## 2012-06-03 NOTE — Progress Notes (Signed)
The patient feels that we (nursing staff) have "poured coke down the IV" and caused his blood sugar to rise between the 160'2 and 190's. The patient is not receptive to post-op education about higher CBG's following surgery. He refuses to eat or drink anything due to the fact that it will cause a higher CBG. He wants to be able to administer any amount of insulin at any time per his own regimen. He continues to check his CBG with his own meter continuously, every 15 to 20 minutes. He has been very inpatient with nursing. Dr. Shon Baton was notified and made aware. No further orders given at this time, yet he will make a medical consult to aid in the care. Pharmacy and the Diabetic Coordinator was also consulted to help. Arnoldo Morale RN

## 2012-06-03 NOTE — Progress Notes (Signed)
PT Cancellation Note  Treatment cancelled today due to patient's refusal to participate.  Pt reports CBG is "too elevated and continues to climb."  Pt agreeable to possibly attempt mobility later pending improvement of current CBG issue.  Will follow as able.  Alenah Sarria M 06/03/2012, 11:57 AM  06/03/2012 Cephus Shelling, PT, DPT [phone]

## 2012-06-03 NOTE — Progress Notes (Signed)
PT Cancellation Note  Treatment cancelled today due to patient's refusal to participate.  Pt just returned to bed from using the restroom.  Pt also stated "I haven't had anything to eat and they are getting ready to start insulin drip."  Pt request to hold until tomorrow.  Will continue to attempt PT evaluation.  However appeared to move well with RN staff into restroom.  Eddie Vang 06/03/2012, 1:20 PM Jake Shark, PT DPT [phone]

## 2012-06-03 NOTE — Progress Notes (Signed)
    Subjective: 1 Day Post-Op Procedure(s) (LRB): INTRAMEDULLARY (IM) NAIL TIBIAL (Right) Patient reports pain as 3 on 0-10 scale.   Denies CP or SOB.  Voiding without difficulty. Positive flatus. Objective: Vital signs in last 24 hours: Temp:  [97.6 F (36.4 C)-98.7 F (37.1 C)] 98.6 F (37 C) (07/01 0410) Pulse Rate:  [64-89] 80  (07/01 0410) Resp:  [10-18] 16  (07/01 0410) BP: (120-152)/(49-72) 142/70 mmHg (07/01 0410) SpO2:  [98 %-100 %] 98 % (07/01 0410)  Intake/Output from previous day: 06/30 0701 - 07/01 0700 In: 2000 [I.V.:2000] Out: 150 [Blood:150] Intake/Output this shift:    Labs:  Basename 06/02/12 1323 06/02/12 1248  HGB 14.6 13.5    Basename 06/02/12 1323 06/02/12 1248  WBC -- 17.8*  RBC -- 4.30  HCT 43.0 39.9  PLT -- 223    Basename 06/02/12 1333 06/02/12 1323  NA 138 141  K 5.1 5.3*  CL 100 103  CO2 27 --  BUN 14 14  CREATININE 0.95 1.00  GLUCOSE 155* 156*  CALCIUM 9.4 --   No results found for this basename: LABPT:2,INR:2 in the last 72 hours  Physical Exam: Neurologically intact Neurovascular intact Sensation intact distally Compartment soft difficulty voiding - required st. cath  Assessment/Plan: 1 Day Post-Op Procedure(s) (LRB): INTRAMEDULLARY (IM) NAIL TIBIAL (Right) Advance diet Up with therapy Plan for discharge tomorrow xrays pending lovenox for DVT prevention    Venita Lick, MD [facility] [phone]

## 2012-06-04 ENCOUNTER — Inpatient Hospital Stay (HOSPITAL_COMMUNITY): Payer: Managed Care, Other (non HMO)

## 2012-06-04 LAB — GLUCOSE, CAPILLARY
Glucose-Capillary: 164 mg/dL — ABNORMAL HIGH (ref 70–99)
Glucose-Capillary: 202 mg/dL — ABNORMAL HIGH (ref 70–99)
Glucose-Capillary: 115 mg/dL — ABNORMAL HIGH (ref 70–99)
Glucose-Capillary: 155 mg/dL — ABNORMAL HIGH (ref 70–99)
Glucose-Capillary: 77 mg/dL (ref 70–99)
Glucose-Capillary: 140 mg/dL — ABNORMAL HIGH (ref 70–99)
Glucose-Capillary: 173 mg/dL — ABNORMAL HIGH (ref 70–99)
Glucose-Capillary: 166 mg/dL — ABNORMAL HIGH (ref 70–99)

## 2012-06-04 MED ORDER — ENOXAPARIN SODIUM 40 MG/0.4ML ~~LOC~~ SOLN: 40.0000 mg | SUBCUTANEOUS | Status: DC

## 2012-06-04 MED ORDER — OXYCODONE-ACETAMINOPHEN 10-325 MG PO TABS: 1.0000 | ORAL_TABLET | Freq: Four times a day (QID) | ORAL | Status: AC | PRN

## 2012-06-04 MED ORDER — MORPHINE SULFATE 2 MG/ML IJ SOLN
2.0000 mg | INTRAMUSCULAR | Status: DC | PRN
  Administered 2012-06-04 (×2): 2 mg via INTRAVENOUS
  Filled 2012-06-04 (×2): qty 1

## 2012-06-04 MED ORDER — METHOCARBAMOL 500 MG PO TABS: 500.0000 mg | ORAL_TABLET | Freq: Three times a day (TID) | ORAL | Status: AC

## 2012-06-04 MED ORDER — ONDANSETRON HCL 4 MG PO TABS: 4.0000 mg | ORAL_TABLET | Freq: Three times a day (TID) | ORAL | Status: AC | PRN

## 2012-06-04 MED ORDER — POLYETHYLENE GLYCOL 3350 17 G PO PACK: 17.0000 g | PACK | Freq: Every day | ORAL | Status: AC

## 2012-06-04 NOTE — Progress Notes (Signed)
Pt began to c/o severe pain in RLE after he slid his leg over in the bed. He stated he felt a "pop" and that something wrong happened to his foot. No relief obtained with pain meds. Order obtained for increased morphine and a stat xray of RLE to r/o any changes. As transport in room, CBG obtained with result of 58. D50 given per glucostabilizer orders. Insulin drip stopped at this time. Will continue to monitor.

## 2012-06-04 NOTE — Progress Notes (Signed)
CARE MANAGEMENT NOTE 06/04/2012  Patient:  Eddie Vang, Eddie Vang   Account Number:  [id number]  Date Initiated:  06/03/2012  Documentation initiated by:  Vance Peper  Subjective/Objective Assessment:   51 yr old male s/p ORIF of posterior malleoulus fracture     Action/Plan:   CM will follow, patient has to be seen by therapy  06/04/12 no home health needs.   Anticipated DC Date:  06/04/2012   Anticipated DC Plan:  HOME/SELF CARE      DC Planning Services  CM consult      PAC Choice  DURABLE MEDICAL EQUIPMENT   Choice offered to / List presented to:     DME arranged  Levan Hurst      DME agency  [facility]        Status of service:  Completed, signed off Medicare Important Message given?   (If response is "NO", the following Medicare IM given date fields will be blank) Date Medicare IM given:   Date Additional Medicare IM given:    Discharge Disposition:  HOME/SELF CARE  Per UR Regulation:    If discussed at Long Length of Stay Meetings, dates discussed:    Comments:

## 2012-06-04 NOTE — Progress Notes (Signed)
Inpatient Diabetes Program Recommendations  AACE/ADA: New Consensus Statement on Inpatient Glycemic Control (2009)  Target Ranges:  Prepandial:   less than 140 mg/dL      Peak postprandial:   less than 180 mg/dL (1-2 hours)      Critically ill patients:  140 - 180 mg/dL   Patient responded well to IV insulin gtt.  Patient will be discharged home today and will resume his home insulin regimen at discontinuation of the insulin gtt and discharge home.  Patient was able to explain timing of his insulin onset/peak/duration.  He said that he was very tired and fell asleep after taking his NPH and Regular insulin just before eating.  He was waiting the 30 minutes that is required before eating when using R insulin and fell asleep.  No further intervention needed at this time.    Thank you  Piedad Climes [facility] Inpatient Diabetes Coordinator [phone]

## 2012-06-04 NOTE — Discharge Summary (Signed)
Patient ID: Eddie Vang MRN: [id number] DOB/AGE: March 25, 1961 51 y.o.  Admit date: 06-27-12 Discharge date: 06/04/2012  Admission Diagnoses:  Tib-fib fracture, comminuted plus posterior malleolus fracture subluxation of the ankle on the right side.   Discharge Diagnoses:  Tib-fib fracture, comminuted plus posterior malleolus fracture subluxation of the ankle on the right side.  OPERATIVE PROCEDURES:  1. IM nail fixation of tibia fracture.  2. Open reduction, internal fixation of posterior malleolus fracture   Past Medical History  Diagnosis Date  . Diabetes mellitus     Surgeries: Procedure(s): INTRAMEDULLARY (IM) NAIL TIBIAL on 27-Jun-2012   Consultants: none  Discharged Condition: Improved  Hospital Course: Eddie Vang is an 51 y.o. male who was admitted 06-27-12 for operative treatment of Tib-fib fracture, comminuted plus posterior malleolus fracture subluxation of the ankle on the right side.  Patient failed conservative treatments (please see the history and physical for the specifics) and had severe unremitting pain that affects sleep, daily activities and work/hobbies. After pre-op clearance, the patient was taken to the operating room on 27-Jun-2012 and underwent  Procedure(s): INTRAMEDULLARY (IM) NAIL TIBIAL.    Patient was given perioperative antibiotics: Anti-infectives     Start     Dose/Rate Route Frequency Ordered Stop   Jun 27, 2012 2200   ceFAZolin (ANCEF) IVPB 1 g/50 mL premix        1 g 100 mL/hr over 30 Minutes Intravenous Every 6 hours 06/27/12 1842 06/03/12 1042   June 27, 2012 1415   ceFAZolin (ANCEF) IVPB 2 g/50 mL premix        2 g 100 mL/hr over 30 Minutes Intravenous  Once June 27, 2012 1412             Patient was given sequential compression devices and early ambulation to prevent DVT.   Patient benefited maximally from hospital stay and there were no complications. At the time of discharge, the patient was urinating/moving their  bowels without difficulty, tolerating a regular diet, pain is controlled with oral pain medications and they have been cleared by PT/OT.   Recent vital signs: Patient Vitals for the past 24 hrs:  BP Temp Temp src Pulse Resp SpO2  06/03/12 2142 115/60 mmHg 99.5 F (37.5 C) Oral 80  16  97 %  06/03/12 1408 114/61 mmHg 97.9 F (36.6 C) Oral 98  17  100 %  06/03/12 1406 119/51 mmHg 99.6 F (37.6 C) Oral 81  17  99 %     Recent laboratory studies:  Basename 06/27/12 1333 27-Jun-2012 1323 06-27-2012 1248  WBC -- -- 17.8*  HGB -- 14.6 13.5  HCT -- 43.0 39.9  PLT -- -- 223  NA 138 141 --  K 5.1 5.3* --  CL 100 103 --  CO2 27 -- --  BUN 14 14 --  CREATININE 0.95 1.00 --  GLUCOSE 155* 156* --  INR -- -- --  CALCIUM 9.4 -- --     Discharge Medications:   Medication List  As of 06/04/2012  7:34 AM   TAKE these medications         atorvastatin 10 MG tablet   Commonly known as: LIPITOR   Take 10 mg by mouth at bedtime.      enoxaparin 40 MG/0.4ML injection   Commonly known as: LOVENOX   Inject 0.4 mLs (40 mg total) into the skin daily. For 10 (ten) days      insulin NPH 100 UNIT/ML injection   Commonly known as: HUMULIN N,NOVOLIN N  Inject 7-12 Units into the skin 2 (two) times daily before a meal. Inject 12 units in the morning and 7 units at night.      insulin regular 100 units/mL injection   Commonly known as: NOVOLIN R,HUMULIN R   Inject 5 Units into the skin 2 (two) times daily before a meal.      methocarbamol 500 MG tablet   Commonly known as: ROBAXIN   Take 1 tablet (500 mg total) by mouth 3 (three) times daily. MAX 3 pills daily      ondansetron 4 MG tablet   Commonly known as: ZOFRAN   Take 1 tablet (4 mg total) by mouth every 8 (eight) hours as needed for nausea. MAX 3 pills daily      oxyCODONE-acetaminophen 10-325 MG per tablet   Commonly known as: PERCOCET   Take 1 tablet by mouth every 6 (six) hours as needed for pain. MAX 4 pills daily      polyethylene  glycol packet   Commonly known as: MIRALAX / GLYCOLAX   Take 17 g by mouth daily. Take 1 packet daily until bowels become regular            Diagnostic Studies: Dg Chest 2 View  06/02/2012  *RADIOLOGY REPORT*  Clinical Data: Tib-fib fracture, preop  CHEST - 2 VIEW  Comparison: None.  Findings: Lungs clear.  Heart size and pulmonary vascularity normal.  No effusion.  Visualized bones unremarkable.  IMPRESSION: No acute disease  Original Report Authenticated By: Osa Craver, M.D.   Dg Tibia/fibula Right  06/04/2012  *RADIOLOGY REPORT*  Clinical Data: Pain.  RIGHT TIBIA AND FIBULA - 2 VIEW  Comparison: Sixth 30 13  Findings: Intramedullary nail is present within the right tibia across the comminuted distal tibial fracture.  Stable alignment since intraoperative spot imaging.  Minimally-displaced distal fibular shaft fractures again noted, stable.  IMPRESSION: Stable alignment and appearance.  Original Report Authenticated By: Cyndie Chime, M.D.   Dg Tibia/fibula Right  06/02/2012  *RADIOLOGY REPORT*  Clinical Data: Operative reduction and internal fixation of tibial fracture  DG C-ARM 61-120 MIN,RIGHT TIBIA AND FIBULA - 2 VIEW  Comparison:  06/02/2012 at 12:45 p.m.  Findings: This series of eight fluoroscopic spot images depicts the tibia and fibula in frontal and lateral projections.  An antegrade intramedullary rod is present with two distal and single proximal interlocking screws.  There are also two cannulated lag screws extending from anterior to posterior approach in the distal tibial metaphysis but not interlocking with the nail. Considerably improved alignment of fracture fragments is noted in both the tibia and fibula, with dramatically reduced angulation.  No complicating features are observed.  IMPRESSION:  1.  Operative reduction and internal fixation of comminuted tibial shaft fracture, with markedly improved alignment and positioning of fracture fragments.  Original Report  Authenticated By: Dellia Cloud, M.D.   Dg Tibia/fibula Right  06/02/2012  *RADIOLOGY REPORT*  Clinical Data: History of trauma from a fall.  RIGHT TIBIA AND FIBULA - 2 VIEW  Comparison: No priors.  Findings: There are comminuted fractures of the distal third of the tibial and fibular diaphyses.  Both fractures are angulated approximately 20 degrees posteriorly.  Fracture fragments are mildly distracted.  Overlying soft tissues are extremely swollen.  IMPRESSION: 1.  Comminuted distracted and angulated fractures of the distal third of the right tibial and fibular diaphyses with overlying soft tissue swelling, as above.  Original Report Authenticated By: Florencia Reasons, M.D.  Dg Knee Right Port  06/03/2012  *RADIOLOGY REPORT*  Clinical Data: Fall  PORTABLE RIGHT KNEE - 1-2 VIEW  Comparison: 06/02/2012  Findings: Intramedullary rod is present within the proximal tibia. Single proximal interlocking screw.  Femur and patella are intact.  IMPRESSION: Stabilization hardware in the tibia.  Original Report Authenticated By: Donavan Burnet, M.D.   Dg Ankle Right Port  06/03/2012  *RADIOLOGY REPORT*  Clinical Data: Fall  PORTABLE RIGHT ANKLE - 2 VIEW  Comparison: None.  Findings: Intramedullary rod is seen transfixing a distal tibia fracture.  There are to distal interlocking screws.  There is an intra-articular component of the distal tibia fracture involving the posterior malleolus.  There are two screws transfixing the posterior malleolus fracture with anatomic alignment.  No breakage or loosening of the hardware.  One of the distal interlocking screws traverses the syndesmosis. There is overall anatomic alignment.  The ankle mortise is anatomic.  IMPRESSION: ORIF distal tibia fracture.  Original Report Authenticated By: Donavan Burnet, M.D.   Dg C-arm 61-120 Min  06/02/2012  *RADIOLOGY REPORT*  Clinical Data: Operative reduction and internal fixation of tibial fracture  DG C-ARM 61-120 MIN,RIGHT  TIBIA AND FIBULA - 2 VIEW  Comparison:  06/02/2012 at 12:45 p.m.  Findings: This series of eight fluoroscopic spot images depicts the tibia and fibula in frontal and lateral projections.  An antegrade intramedullary rod is present with two distal and single proximal interlocking screws.  There are also two cannulated lag screws extending from anterior to posterior approach in the distal tibial metaphysis but not interlocking with the nail. Considerably improved alignment of fracture fragments is noted in both the tibia and fibula, with dramatically reduced angulation.  No complicating features are observed.  IMPRESSION:  1.  Operative reduction and internal fixation of comminuted tibial shaft fracture, with markedly improved alignment and positioning of fracture fragments.  Original Report Authenticated By: Dellia Cloud, M.D.    Discharge Orders    Future Orders Please Complete By Expires   Diet - low sodium heart healthy      Call MD / Call 911      Comments:   If you experience chest pain or shortness of breath, CALL 911 and be transported to the hospital emergency room.  If you develope a fever above 101 F, pus (white drainage) or increased drainage or redness at the wound, or calf pain, call your surgeon's office.   Constipation Prevention      Comments:   Drink plenty of fluids.  Prune juice may be helpful.  You may use a stool softener, such as Colace (over the counter) 100 mg twice a day.  Use MiraLax (over the counter) for constipation as needed.   Increase activity slowly as tolerated      Discharge instructions      Comments:   Keep incision clean and dry.  Sponge bathe only.  Do NOT get the dressings or wound wet.   Driving restrictions      Comments:   No driving   Face-to-face encounter (required for Medicare/Medicaid patients)      Comments:   I Gwinda Maine certify that this patient is under my care and that I, or a nurse practitioner or physician's assistant working  with me, had a face-to-face encounter that meets the physician face-to-face encounter requirements with this patient on 06/04/2012.   Questions: Responses:   The encounter with the patient was in whole, or in part, for the following medical  condition, which is the primary reason for home health care right tib/fib fracture   I certify that, based on my findings, the following services are medically necessary home health services Physical therapy   My clinical findings support the need for the above services OTHER SEE COMMENTS   Further, I certify that my clinical findings support that this patient is homebound (i.e. absences from home require considerable and taxing effort and are for medical reasons or religious services or infrequently or of short duration when for other reasons) Ambulates short distances less than 300 feet   To provide the following care/treatments PT    OT      Follow-up Information    Follow up with Alvy Beal, MD in 2 weeks.   Contact information:   [facility] [address], [address] [place] [address] [phone]          Discharge Plan:  discharge to Home with home health services  Disposition: stable at the time of discharge     Signed: Gwinda Maine for Dr. Venita Lick [facility] [phone] 06/04/2012, 7:34 AM

## 2012-06-04 NOTE — Progress Notes (Signed)
.Physical Therapy Evaluation Note  Past Medical History  Diagnosis Date  . Diabetes mellitus     Past Surgical History  Procedure Date  . Tibia im nail insertion 06/02/2012    Procedure: INTRAMEDULLARY (IM) NAIL TIBIAL;  Surgeon: Venita Lick, MD;  Location: [facility] OR;  Service: Orthopedics;  Laterality: Right;  with ORIF posterior malleolus fracture     06/04/12 1015  PT Visit Information  Last PT Received On 06/04/12  Assistance Needed +1  PT Time Calculation  PT Start Time 1015  PT Stop Time 1041  PT Time Calculation (min) 26 min  Subjective Data  Subjective Pt received supine in bed with report of minimal R LE pain.  Restrictions  Weight Bearing Restrictions Yes  RLE Weight Bearing NWB  Home Living  Lives With Spouse  Available Help at Discharge Family;Available PRN/intermittently (at night only)  Type of Home (condo)  Home Access Stairs to enter  Entrance Stairs-Number of Steps 13  Entrance Stairs-Rails Right;Left (to wide to reach both)  Home Layout One level  Database administrator Yes  How Accessible Accessible via walker  Home Adaptive Equipment Crutches;Shower chair with back  Prior Function  Level of Independence Independent  Able to Take Stairs? 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 No difficulties  Cognition  Overall Cognitive Status Appears within functional limits for tasks assessed/performed  Arousal/Alertness Awake/alert  Orientation Level Oriented X4 / Intact  Behavior During Session Anxious  Right Upper Extremity Assessment  RUE ROM/Strength/Tone WFL  Left Upper Extremity Assessment  LUE ROM/Strength/Tone WFL  Right Lower Extremity Assessment  RLE ROM/Strength/Tone Deficits (hip/knee WFL, ankle no ROM due to cast)  Left Lower Extremity Assessment  LLE ROM/Strength/Tone WFL for tasks assessed  Trunk  Assessment  Trunk Assessment Normal  Bed Mobility  Bed Mobility Supine to Sit  Supine to Sit 6: Modified independent (Device/Increase time);HOB flat  Transfers  Transfers Sit to Stand;Stand to Sit  Sit to Stand 5: Supervision;With upper extremity assist;From bed (min guard when using crutches due to impulsivity)  Stand to Sit 5: Supervision;With upper extremity assist;To chair/3-in-1;To bed  Details for Transfer Assistance v/c's for safe crutch management, decreased speed due to pt impulsivity and LOB upon standing when using crutches requiring minA to maintain balance initially  Ambulation/Gait  Ambulation/Gait Assistance 6: Modified independent (Device/Increase time) (supervision with crutches, mod I with RW)  Ambulation Distance (Feet) 100 Feet  Assistive device Rolling walker;Crutches (completed 50 feet with each device)  Ambulation/Gait Assistance Details pt demo'd safe technique with both crutches and RW  Gait Pattern Step-to pattern  General Gait Details pt 100% compliant with R LE NWB  Stairs Yes  Stairs Assistance 4: Min guard  Stair Management Technique One rail Left;Forwards;With crutches  Number of Stairs 4   Wheelchair Mobility  Wheelchair Mobility No  PT - End of Session  Equipment Utilized During Treatment Gait belt  Activity Tolerance Patient tolerated treatment well  Patient left in chair;with call bell/phone within reach;with family/visitor present  Nurse Communication Mobility status  PT Assessment  Clinical Impression Statement Pt s/p R tibial IM nail presenting with R LE NWB. Patient demo'd safe transfers and ambulation with both RW and crutches. Patient safe to d/c home today at MD discretion. PT signing off at this time due to patient functioning appropriate level for d/c and demo'd safe mobiltiy including stair negotiation to enter home.  Please re-consult if skilled PT needs are required in future.  PT Recommendation/Assessment Patent does not need any further  PT services  No Skilled PT All education completed;Patient is modified independent with all activity/mobility  PT Recommendation  Follow Up Recommendations No PT follow up  Equipment Recommended Rolling walker with 5" wheels (case management notified)  PT General Charges  $$ ACUTE PT VISIT 1 Procedure  PT Evaluation  $Initial PT Evaluation Tier II 1 Procedure  Written Expression  Dominant Hand Right     Lewis Shock, PT, DPT Pager #: [phone] Office #: [phone]

## 2012-06-04 NOTE — Progress Notes (Signed)
Referral received for SNF. Chart reviewed and CSW has spoken with RNCM who indicates that patient is for DC to home with Home Health and DME.  CSW to sign off. Please re-consult if CSW needs arise.  Lura Falor T. Fayelynn Distel, LCSWA  [phone]  

## 2012-06-04 NOTE — Progress Notes (Signed)
Occupational Therapy Evaluation Patient Details Name: Eddie Vang MRN: [id number] DOB: [birth year] Today's Date: 06/04/2012 Time: 4098-1191 OT Time Calculation (min): 17 min  OT Assessment / Plan / Recommendation Clinical Impression  51 yo s/p R ankle fx with ORIF. compmleted all educaiton regarding available AE to assist with ADL and home safety. Pt ahs all nec DME. No further OT needs.    OT Assessment  Patient does not need any further OT services    Follow Up Recommendations  No OT follow up    Barriers to Discharge      Equipment Recommendations  None recommended by OT    Recommendations for Other Services    Frequency       Precautions / Restrictions Restrictions Weight Bearing Restrictions: Yes RLE Weight Bearing: Non weight bearing   Pertinent Vitals/Pain none    ADL  Grooming: Simulated;Modified independent Where Assessed - Grooming: Supported sitting Upper Body Bathing: Performed;Modified independent Where Assessed - Upper Body Bathing: Supported sitting Lower Body Bathing: Performed;Supervision/safety Where Assessed - Lower Body Bathing: Supported sitting;Supported sit to stand Upper Body Dressing: Performed;Modified independent Where Assessed - Upper Body Dressing: Supported sitting Lower Body Dressing: Performed;Supervision/safety Where Assessed - Lower Body Dressing: Supported sit to Pharmacist, hospital: Research scientist (life sciences) Method: Sit to Barista: Regular height toilet Toileting - Clothing Manipulation and Hygiene: Performed;Independent Where Assessed - Toileting Clothing Manipulation and Hygiene: Standing Tub/Shower Transfer: Performed;Supervision/safety Tub/Shower Transfer Method: Squat pivot;Ambulating Tub/Shower Transfer Equipment: Information systems manager with back Equipment Used: Gait belt;Rolling walker Transfers/Ambulation Related to ADLs: mod i ADL Comments: Completed education with pt and wife  regarding available AE. Pt has all DME needed. REviewed saf transfer techniques. Reviewed E conservation techniques and home safety.    OT Diagnosis:    OT Problem List:   OT Treatment Interventions:     OT Goals Acute Rehab OT Goals OT Goal Formulation:  (eval only)  Visit Information  Last OT Received On: 06/04/12 Assistance Needed: +1    Subjective Data      Prior Functioning  Home Living Lives With: Spouse Available Help at Discharge: Family;Available PRN/intermittently (at night only) Type of Home:  (condo) Home Access: Stairs to enter [employer] of Steps: 13 Entrance Stairs-Rails: Right;Left (to wide to reach both) Home Layout: One level Bathroom Shower/Tub: Engineer, manufacturing systems: Standard Bathroom Accessibility: Yes How Accessible: Accessible via walker Home Adaptive Equipment: Crutches;Shower chair with back Prior Function Level of Independence: Independent Able to Take Stairs?: Yes Driving: Yes Vocation: Full time employment 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 Dominant Hand: Right    Cognition  Overall Cognitive Status: Appears within functional limits for tasks assessed/performed Arousal/Alertness: Awake/alert Orientation Level: Oriented X4 / Intact Behavior During Session: Anxious    Extremity/Trunk Assessment Right Upper Extremity Assessment RUE ROM/Strength/Tone: Within functional levels Left Upper Extremity Assessment LUE ROM/Strength/Tone: Within functional levels Right Lower Extremity Assessment RLE ROM/Strength/Tone: Deficits (hip/knee WFL, ankle no ROM due to cast) Left Lower Extremity Assessment LLE ROM/Strength/Tone: WFL for tasks assessed Trunk Assessment Trunk Assessment: Normal   Mobility Bed Mobility Bed Mobility: Supine to Sit Supine to Sit: 6: Modified independent (Device/Increase time);HOB flat Transfers Sit to Stand: 5: Supervision;With upper extremity assist;From bed  (min guard when using crutches due to impulsivity) Stand to Sit: 5: Supervision;With upper extremity assist;To chair/3-in-1;To bed Details for Transfer Assistance: v/c's for safe crutch management, decreased speed due to pt impulsivity and LOB upon standing when using crutches requiring minA to maintain  balance initially   Exercise    Balance    End of Session OT - End of Session Equipment Utilized During Treatment: Gait belt Activity Tolerance: Patient tolerated treatment well Patient left: in bed;with family/visitor present;with call bell/phone within reach  GO     [facility] 06/04/2012, 2:02 PM [facility], OTR/L  [phone] 06/04/2012

## 2012-06-04 NOTE — Progress Notes (Signed)
    Subjective: 2 Days Post-Op Procedure(s) (LRB): INTRAMEDULLARY (IM) NAIL TIBIAL (Right) Patient reports pain as 4 on 0-10 scale.  Had an episode of increased pain last night but has since resolved and is much better. Denies CP or SOB.  OOB to the bathroom; voiding without difficulty. Positive flatus.  Objective: Vital signs in last 24 hours: Temp:  [97.9 F (36.6 C)-99.6 F (37.6 C)] 99.5 F (37.5 C) (07/01 2142) Pulse Rate:  [80-98] 80  (07/01 2142) Resp:  [16-17] 16  (07/01 2142) BP: (114-119)/(51-61) 115/60 mmHg (07/01 2142) SpO2:  [97 %-100 %] 97 % (07/01 2142)  Intake/Output from previous day: 07/01 0701 - 07/02 0700 In: 240 [P.O.:240] Out: -  Intake/Output this shift:    Labs:  Basename 06/02/12 1323 06/02/12 1248  HGB 14.6 13.5    Basename 06/02/12 1323 06/02/12 1248  WBC -- 17.8*  RBC -- 4.30  HCT 43.0 39.9  PLT -- 223    Basename 06/02/12 1333 06/02/12 1323  NA 138 141  K 5.1 5.3*  CL 100 103  CO2 27 --  BUN 14 14  CREATININE 0.95 1.00  GLUCOSE 155* 156*  CALCIUM 9.4 --   No results found for this basename: LABPT:2,INR:2 in the last 72 hours  Physical Exam: Neurologically intact Neurovascular intact Compartment soft splint clean dry and intact  Assessment/Plan: 2 Days Post-Op Procedure(s) (LRB): INTRAMEDULLARY (IM) NAIL TIBIAL (Right) Discharge home with home health today if cleared by physical therapy Continue care in the meantime  Gwinda Maine for Dr. Venita Lick [facility] [phone] 06/04/2012, 7:47 AM

## 2013-05-23 IMAGING — CR DG KNEE 1-2V PORT*R*
2 series · 2 of 2 positions shown · non-contrast
Comparison: 06/02/2012

CLINICAL DATA: Fall

PORTABLE RIGHT KNEE - 1-2 VIEW

[knee lat]
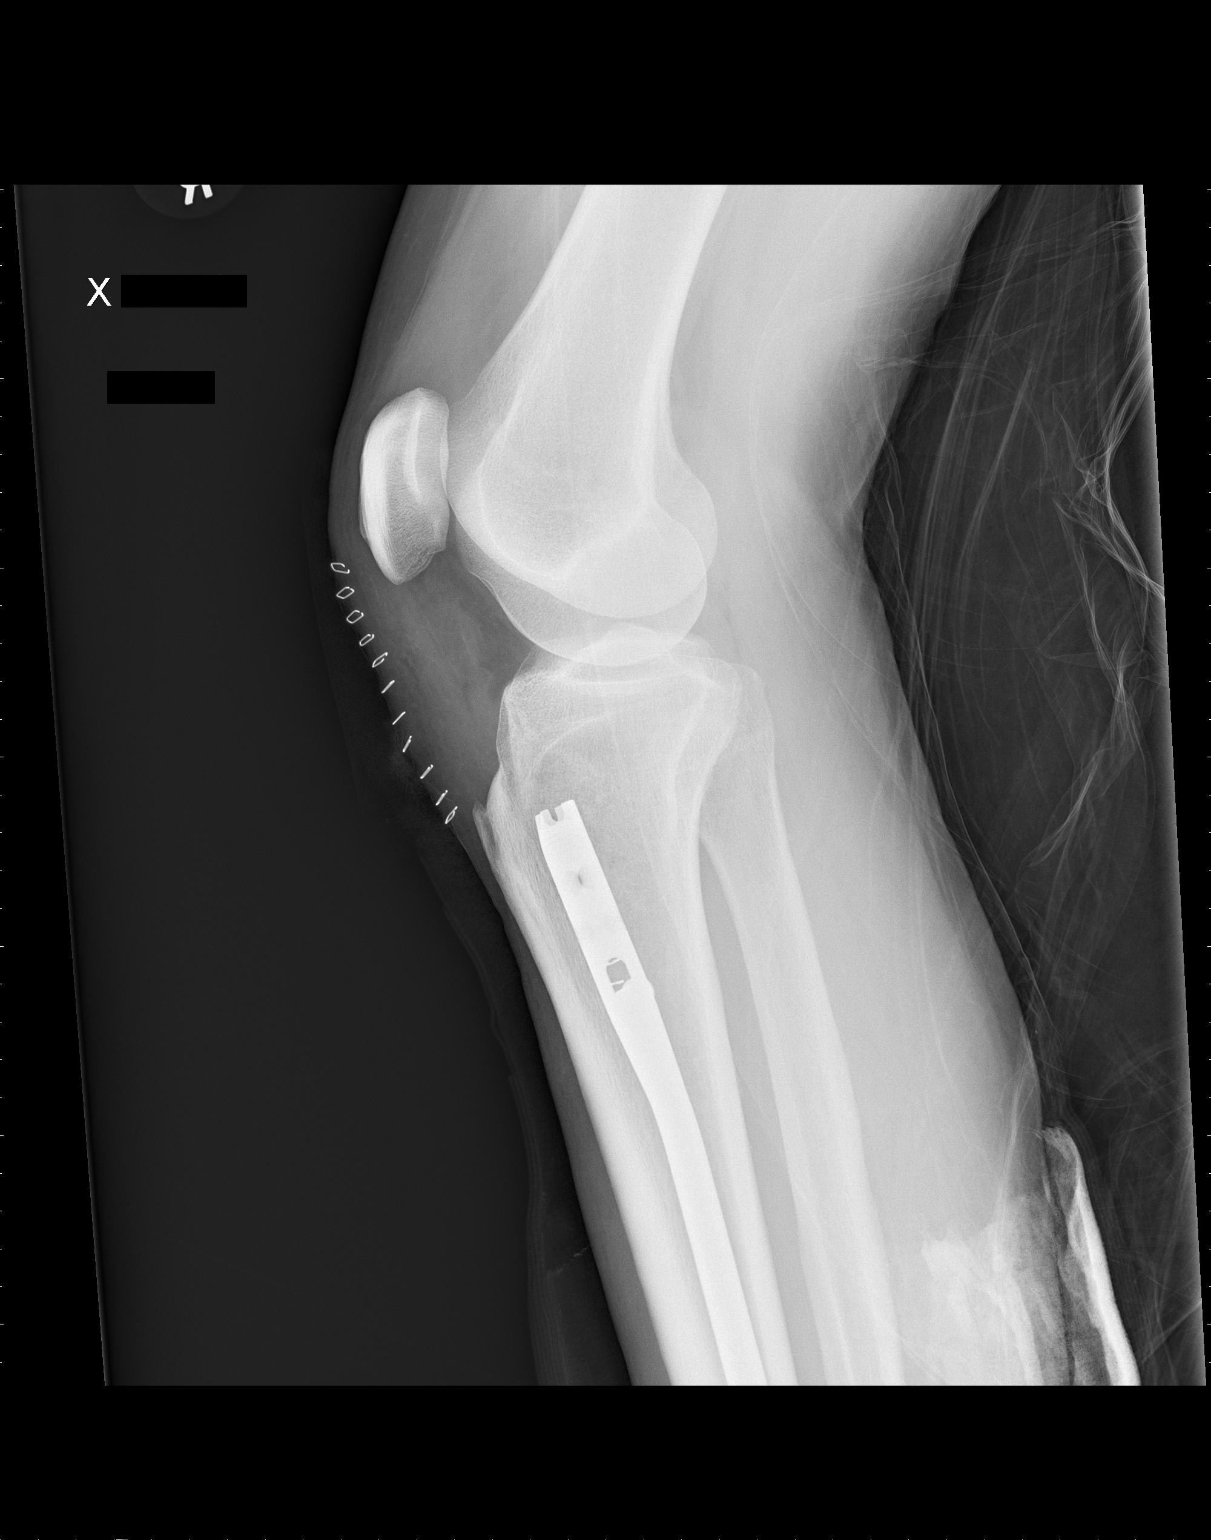

[ap/obl knee]
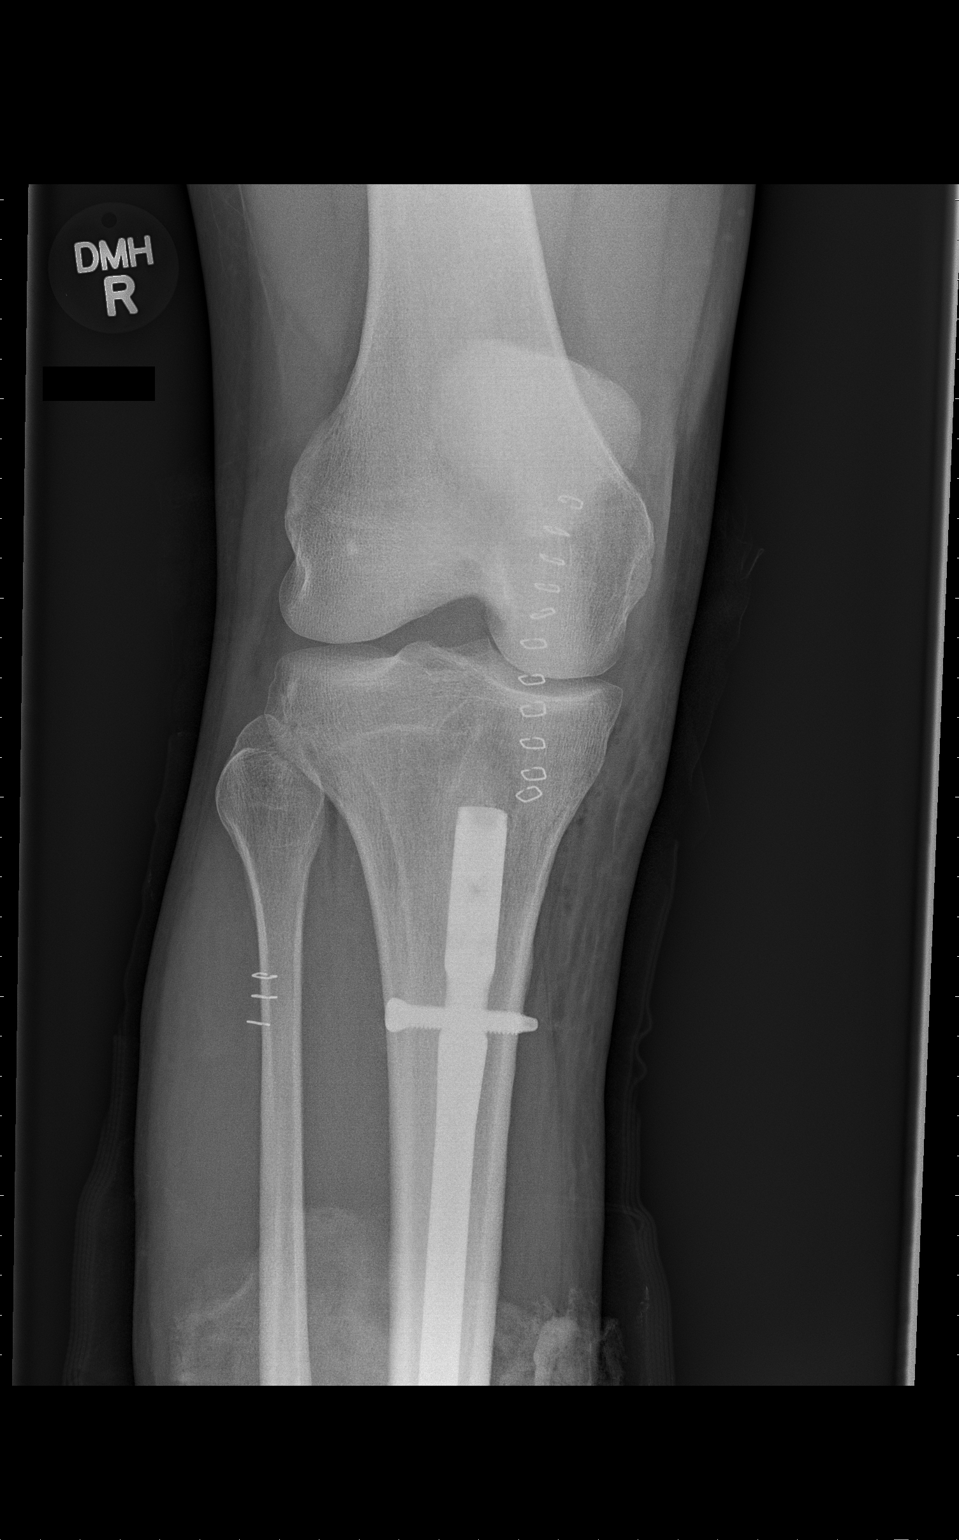

[2 of 2 positions shown; findings below may reference images not displayed]

FINDINGS: Intramedullary rod is present within the proximal tibia.
Single proximal interlocking screw.  Femur and patella are intact.
IMPRESSION: Stabilization hardware in the tibia.

## 2013-05-24 IMAGING — CR DG TIBIA/FIBULA 2V*R*
4 series · 4 of 4 positions shown · non-contrast
Comparison: [DATE]

CLINICAL DATA: Pain.

RIGHT TIBIA AND FIBULA - 2 VIEW

[x tib-fib ap right]
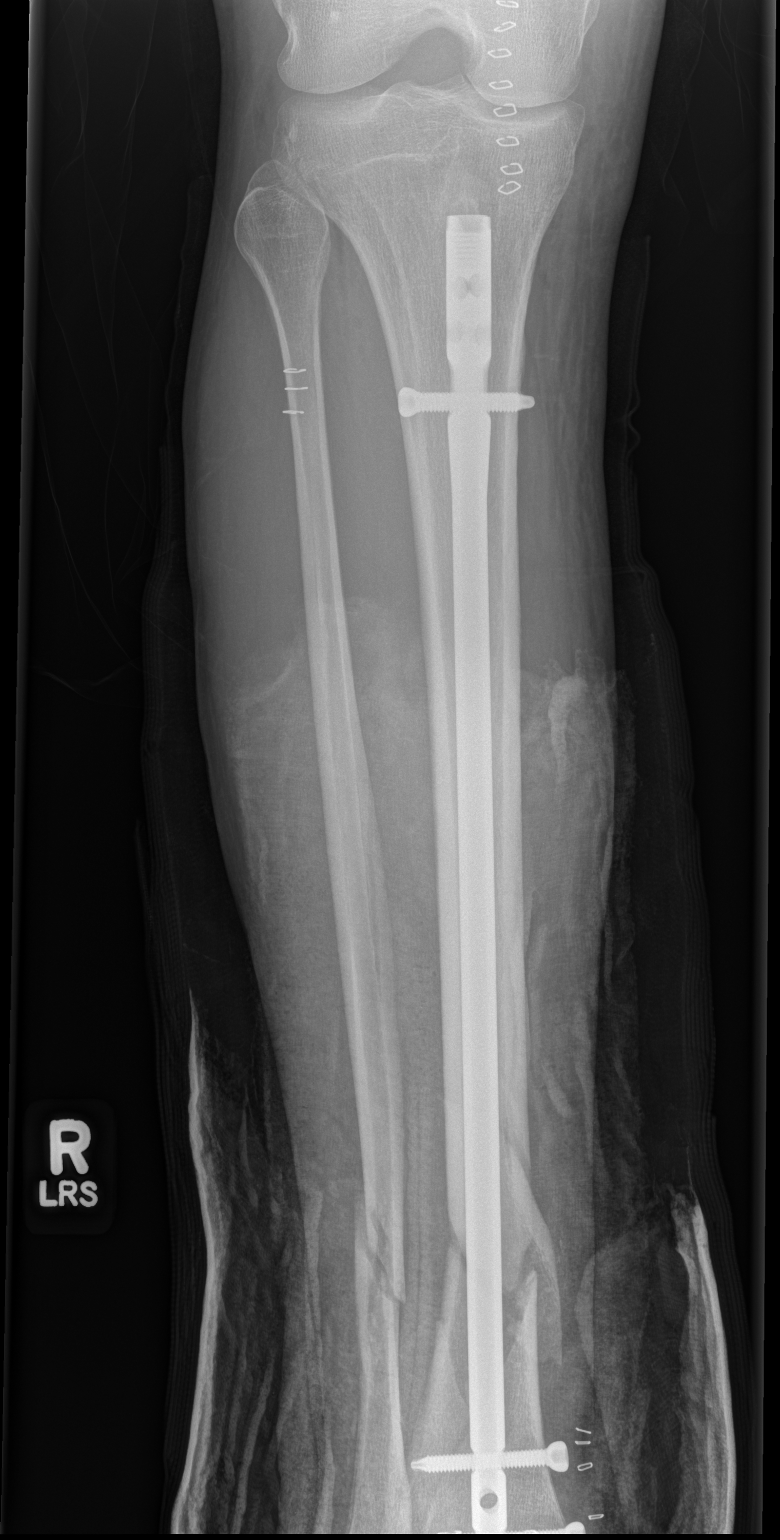

[x tib-fib lat right (1 of 3)]
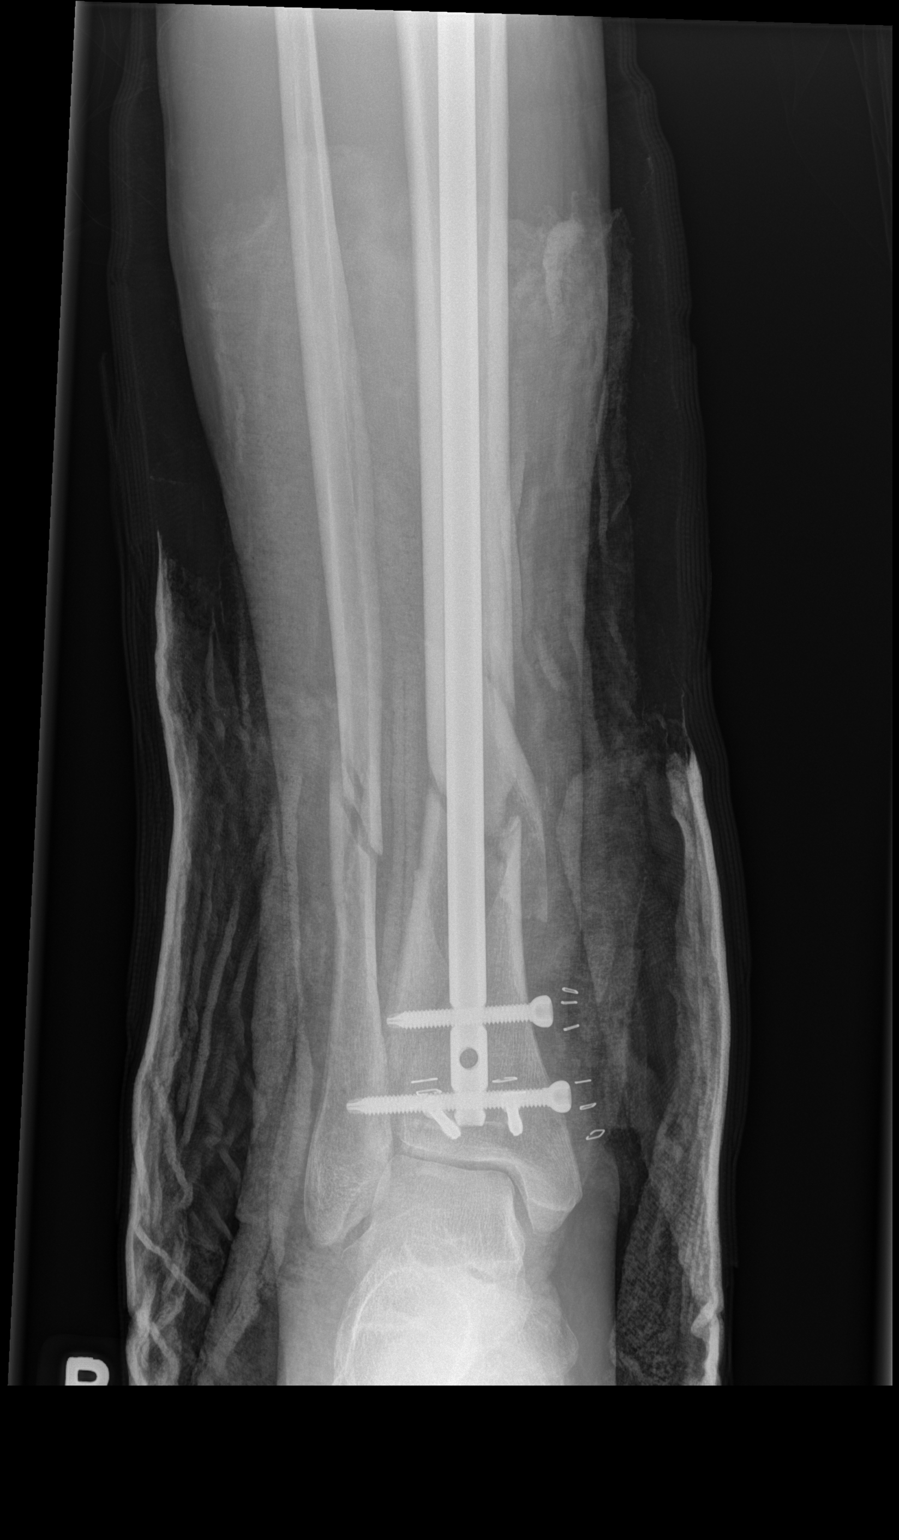

[x tib-fib lat right (2 of 3)]
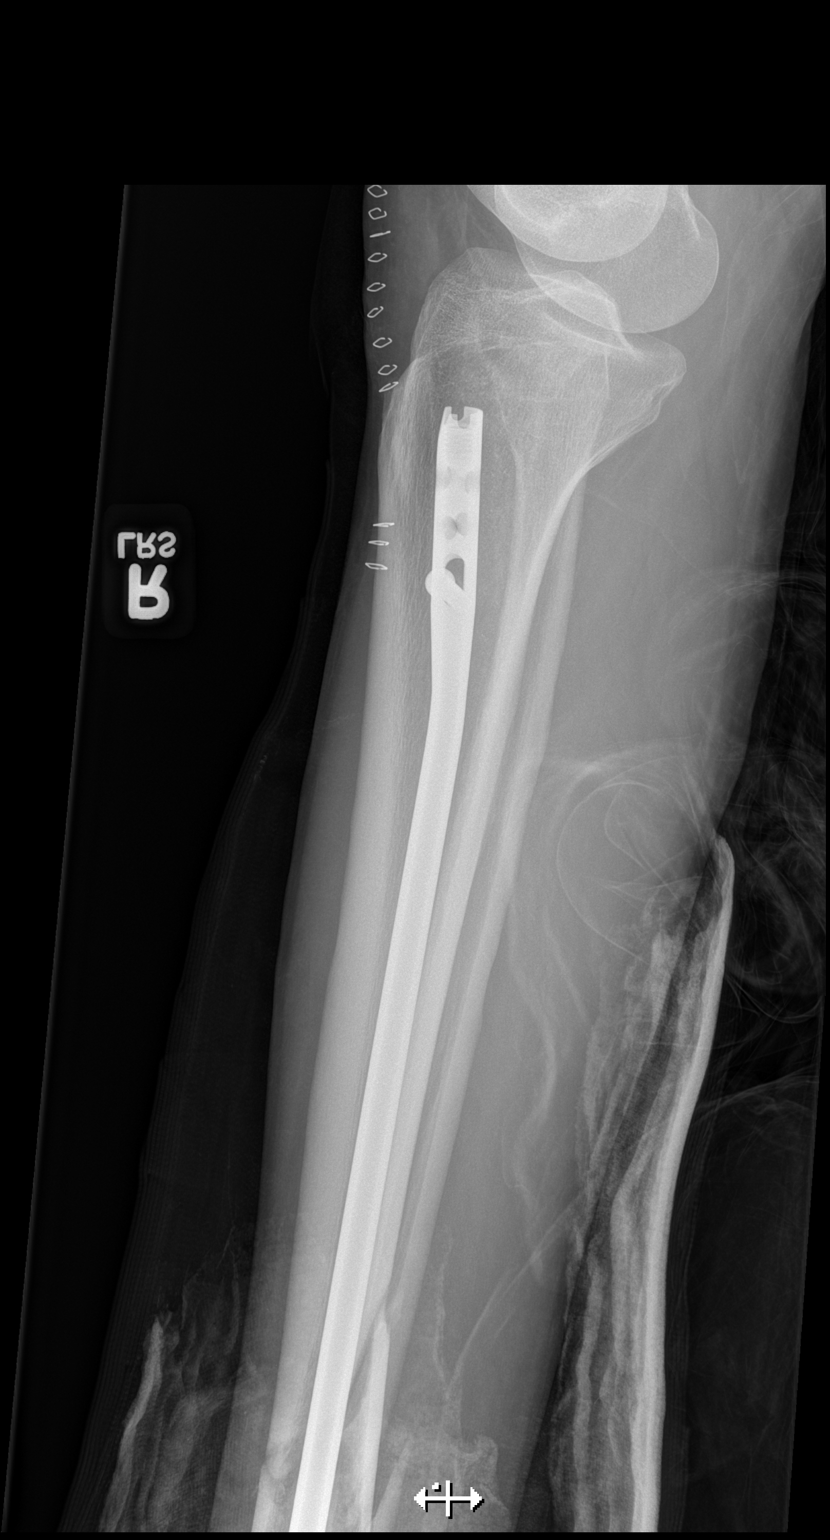

[x tib-fib lat right (3 of 3)]
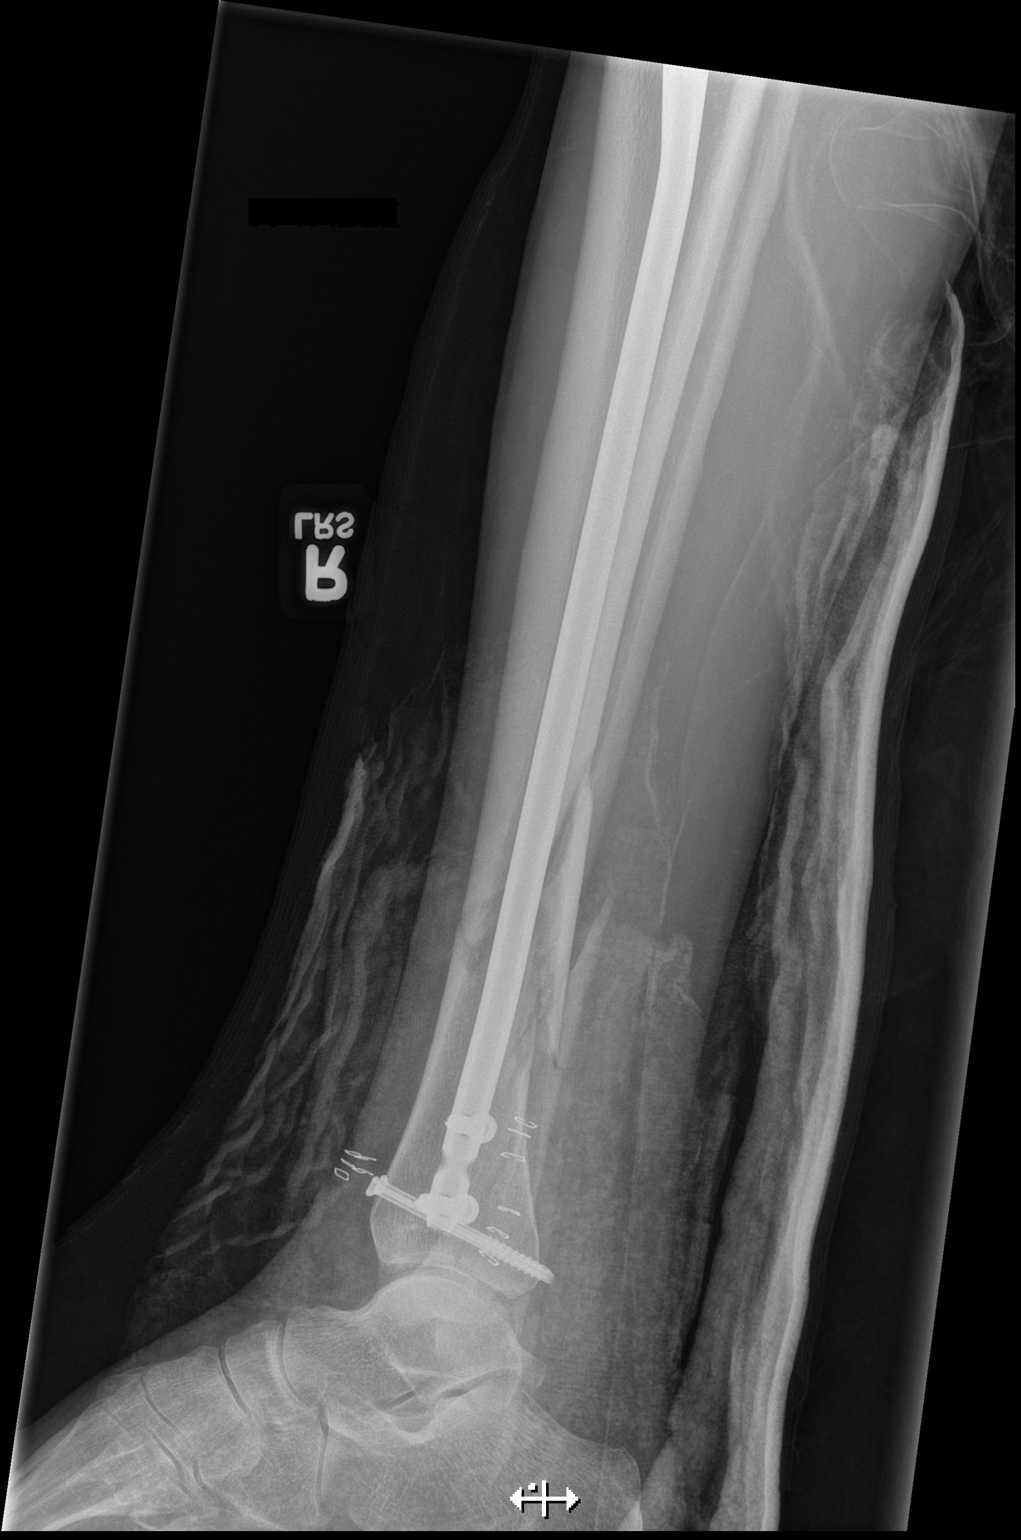

[4 of 4 positions shown; findings below may reference images not displayed]

FINDINGS: Intramedullary nail is present within the right tibia
across the comminuted distal tibial fracture.  Stable alignment
since intraoperative spot imaging.  Minimally-displaced distal
fibular shaft fractures again noted, stable.
IMPRESSION: Stable alignment and appearance.

## 2014-12-01 ENCOUNTER — Emergency Department (HOSPITAL_COMMUNITY)
Admission: EM | Admit: 2014-12-01 | Discharge: 2014-12-01 | Disposition: A | Discharge status: Home / Self Care | Payer: Managed Care, Other (non HMO) | Attending: Emergency Medicine | Admitting: Emergency Medicine

## 2014-12-01 ENCOUNTER — Encounter (HOSPITAL_COMMUNITY): Payer: Self-pay | Admitting: Emergency Medicine

## 2014-12-01 ENCOUNTER — Emergency Department (HOSPITAL_COMMUNITY): Payer: Managed Care, Other (non HMO)

## 2014-12-01 DIAGNOSIS — S299XXA Unspecified injury of thorax, initial encounter: Secondary | ICD-10-CM | POA: Diagnosis present

## 2014-12-01 DIAGNOSIS — W182XXA Fall in (into) shower or empty bathtub, initial encounter: Secondary | ICD-10-CM | POA: Diagnosis not present

## 2014-12-01 DIAGNOSIS — Y998 Other external cause status: Secondary | ICD-10-CM | POA: Diagnosis not present

## 2014-12-01 DIAGNOSIS — S20211A Contusion of right front wall of thorax, initial encounter: Secondary | ICD-10-CM | POA: Insufficient documentation

## 2014-12-01 DIAGNOSIS — Y9389 Activity, other specified: Secondary | ICD-10-CM | POA: Diagnosis not present

## 2014-12-01 DIAGNOSIS — W19XXXA Unspecified fall, initial encounter: Secondary | ICD-10-CM

## 2014-12-01 DIAGNOSIS — Z794 Long term (current) use of insulin: Secondary | ICD-10-CM | POA: Insufficient documentation

## 2014-12-01 DIAGNOSIS — Z79899 Other long term (current) drug therapy: Secondary | ICD-10-CM | POA: Diagnosis not present

## 2014-12-01 DIAGNOSIS — Y92002 Bathroom of unspecified non-institutional (private) residence single-family (private) house as the place of occurrence of the external cause: Secondary | ICD-10-CM | POA: Insufficient documentation

## 2014-12-01 DIAGNOSIS — R52 Pain, unspecified: Secondary | ICD-10-CM

## 2014-12-01 DIAGNOSIS — E109 Type 1 diabetes mellitus without complications: Secondary | ICD-10-CM | POA: Diagnosis not present

## 2014-12-01 LAB — CBG MONITORING, ED
GLUCOSE-CAPILLARY: 63 mg/dL — AB (ref 70–99)
GLUCOSE-CAPILLARY: 94 mg/dL (ref 70–99)

## 2014-12-01 MED ORDER — HYDROMORPHONE HCL 1 MG/ML IJ SOLN
2.0000 mg | Freq: Once | INTRAMUSCULAR | Status: AC
  Administered 2014-12-01: 2 mg via INTRAMUSCULAR
  Filled 2014-12-01: qty 2

## 2014-12-01 MED ORDER — OXYCODONE-ACETAMINOPHEN 5-325 MG PO TABS: 2.0000 | ORAL_TABLET | ORAL | Status: DC | PRN

## 2014-12-01 MED ORDER — NAPROXEN 500 MG PO TABS: 500.0000 mg | ORAL_TABLET | Freq: Two times a day (BID) | ORAL | Status: DC

## 2014-12-01 NOTE — ED Notes (Signed)
Pt returned from scans. Monitored by pulse ox, bp cuff, and 5-lead. 

## 2014-12-01 NOTE — ED Notes (Signed)
Pt monitored by pulse ox, bp cuff, and 12-lead. 

## 2014-12-01 NOTE — Discharge Instructions (Signed)
Please follow the directions provided.  Be sure to follow-up with your primary care provider to ensure you are getting better.  Use the pain medicines to help decrease your pain so you can continue to take deep breaths.  Use the incentive spirometer to help encourage you to take deep breaths to help avoid getting a pneumonia.  Don't hesitate to return for any new, worsening or concerning symptoms.     SEEK IMMEDIATE MEDICAL CARE IF:  You have difficulty breathing.  You feel sick to your stomach (nausea), have vomiting or belly (abdominal) pain.  You have worsening pain, not controlled with medications, or there is a change in the location of the pain.  You develop sweating or radiation of the pain into the arms, jaw or shoulders, or become light headed or faint.

## 2014-12-01 NOTE — ED Provider Notes (Signed)
CSN: [id number]     Arrival date & time 12/01/14  0101 History   First MD Initiated Contact with Patient 12/01/14 0204     Chief Complaint  Patient presents with  . Rib Injury   (Consider location/radiation/quality/duration/timing/severity/associated sxs/prior Treatment) HPI Eddie Vang is a 1 53 yo male presenting with pain to rt posterior ibs after falling in the bathtub this evening.  He states he has a history of Type 1 diabetes and his blood sugar became low this evening causing him to become light-headed and fall.  Since he hit his ribs he has had 10/10 pain he describes as aching to his ribs.  The pain is worsened with movement or taking a deep breath.   He denies cough, shortness of breath.   Past Medical History  Diagnosis Date  . Diabetes mellitus    Past Surgical History  Procedure Laterality Date  . Tibia im nail insertion  06/02/2012    Procedure: INTRAMEDULLARY (IM) NAIL TIBIAL;  Surgeon: Venita Lickahari Brooks, MD;  Location: [facility] OR;  Service: Orthopedics;  Laterality: Right;  with ORIF posterior malleolus fracture   No family history on file. History  Substance Use Topics  . Smoking status: Never Smoker   . Smokeless tobacco: Not on file  . Alcohol Use: No    Review of Systems  Constitutional: Negative for fever and chills.  HENT: Negative for sore throat.   Eyes: Negative for visual disturbance.  Respiratory: Negative for cough and shortness of breath.   Cardiovascular: Negative for chest pain and leg swelling.  Gastrointestinal: Negative for nausea, vomiting and diarrhea.  Genitourinary: Negative for dysuria.  Musculoskeletal: Positive for myalgias.  Skin: Negative for rash.  Neurological: Positive for light-headedness. Negative for weakness, numbness and headaches.    Allergies  Review of patient's allergies indicates no known allergies.  Home Medications   Prior to Admission medications   Medication Sig Start Date End Date Taking? Authorizing  Provider  atorvastatin (LIPITOR) 10 MG tablet Take 10 mg by mouth at bedtime.   Yes Historical Provider, MD  cholecalciferol (VITAMIN D) 1000 UNITS tablet Take 1,000 Units by mouth daily.   Yes Historical Provider, MD  insulin NPH (HUMULIN N,NOVOLIN N) 100 UNIT/ML injection Inject 5-8 Units into the skin 2 (two) times daily before a meal. Inject 8 units in the morning and 5 units at night.   Yes Historical Provider, MD  insulin regular (NOVOLIN R,HUMULIN R) 100 units/mL injection Inject 5 Units into the skin 2 (two) times daily before a meal.   Yes Historical Provider, MD  enoxaparin (LOVENOX) 40 MG/0.4ML injection Inject 0.4 mLs (40 mg total) into the skin daily. For 10 (ten) days Patient not taking: Reported on 12/01/2014 06/04/12   Lowry Bowliana J Kovach, PA-C   BP 140/83 mmHg  Pulse 55  Temp(Src) 97.3 F (36.3 C) (Oral)  Resp 6  SpO2 99% Physical Exam  Constitutional: He appears well-developed and well-nourished. No distress.  HENT:  Head: Normocephalic and atraumatic.  Mouth/Throat: Oropharynx is clear and moist. No oropharyngeal exudate.  Eyes: Conjunctivae are normal.  Neck: Neck supple. No thyromegaly present.  Cardiovascular: Normal rate, regular rhythm and intact distal pulses.   Pulmonary/Chest: Effort normal and breath sounds normal. No respiratory distress. He has no decreased breath sounds. He has no wheezes. He has no rhonchi. He has no rales.   He exhibits no tenderness.  Ecchymosis to right flank/posterior ribs, no deformity noted.  Abdominal: Soft. There is no tenderness.  Lymphadenopathy:  He has no cervical adenopathy.  Neurological: He is alert.  Skin: Skin is warm and dry. No rash noted. He is not diaphoretic.  Psychiatric: He has a normal mood and affect.  Nursing note and vitals reviewed.   ED Course  Procedures (including critical care time) Labs Review Labs Reviewed  CBG MONITORING, ED - Abnormal; Notable for the following:    Glucose-Capillary 63 (*)    All  other components within normal limits    Imaging Review No results found.   EKG Interpretation None      MDM   Final diagnoses:  Rib contusion, right, initial encounter   53 yo with right posterior rib pain after fall subsequent to low blood sugar. Pt's blood sugar has normalized in the ED. Pain managed in the ED and Xrays negative for fracture. Discussed deep breathing exercises to avoid atelectasis. Pt is well-appearing, in no acute distress and vital signs are stable.  They appear safe to be discharged.  Discharge include follow-up with their PCP.  Return precautions provided.    Filed Vitals:   12/01/14 0130 12/01/14 0215 12/01/14 0300 12/01/14 0316  BP: 140/83 125/70 123/73 126/73  Pulse: 55 58 62 67  Temp:      TempSrc:      Resp: 6 12 11 16   SpO2: 99% 99% 95% 97%   Meds given in ED:  Medications  HYDROmorphone (DILAUDID) injection 2 mg (not administered)    Discharge Medication List as of 12/01/2014  3:31 AM    START taking these medications   Details  naproxen (NAPROSYN) 500 MG tablet Take 1 tablet (500 mg total) by mouth 2 (two) times daily., Starting 12/01/2014, Until Discontinued, Print    oxyCODONE-acetaminophen (PERCOCET/ROXICET) 5-325 MG per tablet Take 2 tablets by mouth every 4 (four) hours as needed for moderate pain or severe pain., Starting 12/01/2014, Until Discontinued, Print           Harle BattiestElizabeth Yaire Kreher, NP 12/01/14 1810  April K Palumbo-Rasch, MD 12/01/14 2316

## 2014-12-01 NOTE — ED Notes (Signed)
CBG = 94.  RN and NP informed of results.

## 2014-12-01 NOTE — ED Notes (Signed)
Pt. slipped and fell at bathroom hit his right side against the tub , reports pain at right ribcage , respirations unlabored . Pain worse with palpation and movement .

## 2014-12-04 HISTORY — PX: HERNIA REPAIR: SHX51

## 2016-07-14 ENCOUNTER — Emergency Department (HOSPITAL_COMMUNITY)
Admission: EM | Admit: 2016-07-14 | Discharge: 2016-07-15 | Disposition: A | Discharge status: Home / Self Care | Payer: Managed Care, Other (non HMO) | Attending: Emergency Medicine | Admitting: Emergency Medicine

## 2016-07-14 ENCOUNTER — Emergency Department (HOSPITAL_COMMUNITY): Payer: Managed Care, Other (non HMO)

## 2016-07-14 ENCOUNTER — Encounter (HOSPITAL_COMMUNITY): Payer: Self-pay

## 2016-07-14 DIAGNOSIS — Z794 Long term (current) use of insulin: Secondary | ICD-10-CM | POA: Insufficient documentation

## 2016-07-14 DIAGNOSIS — Z7901 Long term (current) use of anticoagulants: Secondary | ICD-10-CM | POA: Insufficient documentation

## 2016-07-14 DIAGNOSIS — E119 Type 2 diabetes mellitus without complications: Secondary | ICD-10-CM | POA: Insufficient documentation

## 2016-07-14 DIAGNOSIS — R05 Cough: Secondary | ICD-10-CM | POA: Diagnosis present

## 2016-07-14 DIAGNOSIS — J189 Pneumonia, unspecified organism: Secondary | ICD-10-CM

## 2016-07-14 LAB — COMPREHENSIVE METABOLIC PANEL
ALBUMIN: 3.3 g/dL — AB (ref 3.5–5.0)
ALT: 31 U/L (ref 17–63)
ANION GAP: 13 (ref 5–15)
AST: 35 U/L (ref 15–41)
Alkaline Phosphatase: 75 U/L (ref 38–126)
BUN: 8 mg/dL (ref 6–20)
CO2: 25 mmol/L (ref 22–32)
Calcium: 9.2 mg/dL (ref 8.9–10.3)
Chloride: 96 mmol/L — ABNORMAL LOW (ref 101–111)
Creatinine, Ser: 1.02 mg/dL (ref 0.61–1.24)
GFR calc Af Amer: >60 mL/min (ref >60)
GFR calc non Af Amer: >60 mL/min (ref >60)
Glucose, Bld: 156 mg/dL — ABNORMAL HIGH (ref 65–99)
Potassium: 4.2 mmol/L (ref 3.5–5.1)
SODIUM: 134 mmol/L — AB (ref 135–145)
Total Bilirubin: 1.1 mg/dL (ref 0.3–1.2)
Total Protein: 7.8 g/dL (ref 6.5–8.1)

## 2016-07-14 LAB — CBC WITH DIFFERENTIAL/PLATELET
Basophils Absolute: 0 10*3/uL (ref 0.0–0.1)
Basophils Relative: 0 %
Eosinophils Absolute: 0.6 10*3/uL (ref 0.0–0.7)
Eosinophils Relative: 4 %
HEMATOCRIT: 39.7 % (ref 39.0–52.0)
HEMOGLOBIN: 13.1 g/dL (ref 13.0–17.0)
LYMPHS ABS: 2.1 10*3/uL (ref 0.7–4.0)
Lymphocytes Relative: 12 %
MCH: 31.3 pg (ref 26.0–34.0)
MCHC: 33 g/dL (ref 30.0–36.0)
MCV: 94.7 fL (ref 78.0–100.0)
Monocytes Absolute: 1.8 10*3/uL — ABNORMAL HIGH (ref 0.1–1.0)
Monocytes Relative: 10 %
NEUTROS ABS: 13.4 10*3/uL — AB (ref 1.7–7.7)
Neutrophils Relative %: 74 %
Platelets: 427 10*3/uL — ABNORMAL HIGH (ref 150–400)
RBC: 4.19 MIL/uL — ABNORMAL LOW (ref 4.22–5.81)
RDW: 12.9 % (ref 11.5–15.5)
WBC: 18 10*3/uL — ABNORMAL HIGH (ref 4.0–10.5)

## 2016-07-14 NOTE — ED Triage Notes (Signed)
Pt states pain and coughing x 4 days. Pt states unproductive cough. Pt states some nausea/vomiting with cough.

## 2016-07-15 MED ORDER — AMOXICILLIN-POT CLAVULANATE 875-125 MG PO TABS
1.0000 | ORAL_TABLET | Freq: Once | ORAL | Status: AC
  Administered 2016-07-15: 1 via ORAL
  Filled 2016-07-15: qty 1

## 2016-07-15 MED ORDER — HYDROCODONE-HOMATROPINE 5-1.5 MG/5ML PO SYRP
5.0000 mL | ORAL_SOLUTION | Freq: Once | ORAL | Status: AC
  Administered 2016-07-15: 5 mL via ORAL
  Filled 2016-07-15: qty 5

## 2016-07-15 MED ORDER — HYDROCODONE-HOMATROPINE 5-1.5 MG/5ML PO SYRP: 5.0000 mL | ORAL_SOLUTION | Freq: Four times a day (QID) | ORAL | 0 refills | Status: DC | PRN

## 2016-07-15 MED ORDER — AMOXICILLIN-POT CLAVULANATE 875-125 MG PO TABS: 1.0000 | ORAL_TABLET | Freq: Two times a day (BID) | ORAL | 0 refills | Status: DC

## 2016-07-15 NOTE — ED Notes (Signed)
Pt ambulated with pulse oximeter at this time. Pt able to ambulate 120 feet without any change noted to his oxygen saturation.  Pt in no apparent distress at this time.  Will continue to closely monitor pt.

## 2016-07-15 NOTE — ED Provider Notes (Signed)
[facility]-EMERGENCY DEPT Provider Note   CSN: [id number] Arrival date & time: 07/14/16  2013  First Provider Contact:  None       History   Chief Complaint Chief Complaint  Patient presents with  . Cough    HPI Eddie Vang is a 55 y.o. male.  Patient presents to the emergency department with chief complaint of cough. He states that he has had an intermittent cough for the past month. He reports intermittent fevers and chills. States that his symptoms have waxed and waned, but this week became worse. He reports having history of pneumonia. States that he has discomfort when coughing. He has tried OTC medications with no relief. He has not seen anyone for this. He reports that he did get a pneumonia shot.   The history is provided by the patient. No language interpreter was used.    Past Medical History:  Diagnosis Date  . Diabetes mellitus     There are no active problems to display for this patient.   Past Surgical History:  Procedure Laterality Date  . TIBIA IM NAIL INSERTION  06/02/2012   Procedure: INTRAMEDULLARY (IM) NAIL TIBIAL;  Surgeon: Venita Lick, MD;  Location: [facility] OR;  Service: Orthopedics;  Laterality: Right;  with ORIF posterior malleolus fracture       Home Medications    Prior to Admission medications   Medication Sig Start Date End Date Taking? Authorizing Provider  atorvastatin (LIPITOR) 10 MG tablet Take 10 mg by mouth at bedtime.    Historical Provider, MD  cholecalciferol (VITAMIN D) 1000 UNITS tablet Take 1,000 Units by mouth daily.    Historical Provider, MD  enoxaparin (LOVENOX) 40 MG/0.4ML injection Inject 0.4 mLs (40 mg total) into the skin daily. For 10 (ten) days Patient not taking: Reported on 12/01/2014 06/04/12   Norval Gable, PA-C  insulin NPH (HUMULIN N,NOVOLIN N) 100 UNIT/ML injection Inject 5-8 Units into the skin 2 (two) times daily before a meal. Inject 8 units in the morning and 5 units at night.    Historical Provider, MD    insulin regular (NOVOLIN R,HUMULIN R) 100 units/mL injection Inject 5 Units into the skin 2 (two) times daily before a meal.    Historical Provider, MD  naproxen (NAPROSYN) 500 MG tablet Take 1 tablet (500 mg total) by mouth 2 (two) times daily. 12/01/14   Harle Battiest, NP  oxyCODONE-acetaminophen (PERCOCET/ROXICET) 5-325 MG per tablet Take 2 tablets by mouth every 4 (four) hours as needed for moderate pain or severe pain. 12/01/14   Harle Battiest, NP    Family History History reviewed. No pertinent family history.  Social History Social History  Substance Use Topics  . Smoking status: Never Smoker  . Smokeless tobacco: Never Used  . Alcohol use No     Allergies   Review of patient's allergies indicates no known allergies.   Review of Systems Review of Systems  All other systems reviewed and are negative.    Physical Exam Updated Vital Signs BP 146/100 (BP Location: Right Arm)   Pulse 89   Temp 99 F (37.2 C) (Oral)   Resp 16   Ht 5\' 11"  (1.803 m)   Wt 72.1 kg   SpO2 100%   BMI 22.18 kg/m   Physical Exam  Constitutional: He is oriented to person, place, and time. He appears well-developed and well-nourished.  HENT:  Head: Normocephalic and atraumatic.  Eyes: Conjunctivae and EOM are normal. Pupils are equal, round, and reactive to light. Right  eye exhibits no discharge. Left eye exhibits no discharge. No scleral icterus.  Neck: Normal range of motion. Neck supple. No JVD present.  Cardiovascular: Normal rate, regular rhythm and normal heart sounds.  Exam reveals no gallop and no friction rub.   No murmur heard. Pulmonary/Chest: Effort normal and breath sounds normal. No respiratory distress. He has no wheezes. He has no rales. He exhibits no tenderness.  Abdominal: Soft. He exhibits no distension and no mass. There is no tenderness. There is no rebound and no guarding.  Musculoskeletal: Normal range of motion. He exhibits no edema or tenderness.   Neurological: He is alert and oriented to person, place, and time.  Skin: Skin is warm and dry.  Psychiatric: He has a normal mood and affect. His behavior is normal. Judgment and thought content normal.  Nursing note and vitals reviewed.    ED Treatments / Results  Labs (all labs ordered are listed, but only abnormal results are displayed) Labs Reviewed  COMPREHENSIVE METABOLIC PANEL - Abnormal; Notable for the following:       Result Value   Sodium 134 (*)    Chloride 96 (*)    Glucose, Bld 156 (*)    Albumin 3.3 (*)    All other components within normal limits  CBC WITH DIFFERENTIAL/PLATELET - Abnormal; Notable for the following:    WBC 18.0 (*)    RBC 4.19 (*)    Platelets 427 (*)    Neutro Abs 13.4 (*)    Monocytes Absolute 1.8 (*)    All other components within normal limits    EKG  EKG Interpretation None       Radiology Dg Chest 2 View  Result Date: 07/14/2016 CLINICAL DATA:  Cough and chest pain since 07/10/2016. EXAM: CHEST  2 VIEW COMPARISON:  None. FINDINGS: Extending anteriorly from the right hila in the right middle and upper lobes there are irregular interstitial opacities with intervening hazy airspace opacity, new when compared the prior study. On the lateral view, there is no evidence of a hilar mass. Remainder of the lungs is clear. No pleural effusion or pneumothorax. Cardiac silhouette is normal in size. Normal mediastinal and left hilar contours. Skeletal structures are intact. IMPRESSION: 1. Interstitial and hazy airspace opacities in the anterior inferior right upper lobe and right middle lobe extending from the right hilum. This is consistent with pneumonia. If symptoms are atypical for pneumonia, recommend follow-up chest CT with contrast for further assessment. Electronically Signed   By: Amie Portland M.D.   On: 07/14/2016 21:46    Procedures Procedures (including critical care time)  Medications Ordered in ED Medications - No data to  display   Initial Impression / Assessment and Plan / ED Course  I have reviewed the triage vital signs and the nursing notes.  Pertinent labs & imaging results that were available during my care of the patient were reviewed by me and considered in my medical decision making (see chart for details).  Clinical Course    Patient with cough.  CXR consistent with pneumonia in upper right and middle lobe.  VSS, afebrile.  No recent admission, abx.  Will treat with augmentin and DC to home with PCP follow-up.  Final Clinical Impressions(s) / ED Diagnoses   Final diagnoses:  CAP (community acquired pneumonia)    New Prescriptions Discharge Medication List as of 07/15/2016 12:13 AM    START taking these medications   Details  amoxicillin-clavulanate (AUGMENTIN) 875-125 MG tablet Take 1 tablet by mouth  every 12 (twelve) hours., Starting Sat 07/15/2016, Print    HYDROcodone-homatropine (HYCODAN) 5-1.5 MG/5ML syrup Take 5 mLs by mouth every 6 (six) hours as needed for cough., Starting Sat 07/15/2016, Print         Roxy Horsemanobert Yaretzi Ernandez, PA-C 07/15/16 [id number]    Zadie Rhineonald Wickline, MD 07/15/16 [phone]

## 2018-01-03 ENCOUNTER — Emergency Department (HOSPITAL_COMMUNITY): Payer: Managed Care, Other (non HMO) | Admitting: Certified Registered"

## 2018-01-03 ENCOUNTER — Encounter (HOSPITAL_COMMUNITY): Payer: Self-pay

## 2018-01-03 ENCOUNTER — Observation Stay (HOSPITAL_COMMUNITY)
Admission: EM | Admit: 2018-01-03 | Discharge: 2018-01-04 | Disposition: A | Discharge status: Home / Self Care | Payer: Managed Care, Other (non HMO) | Attending: General Surgery | Admitting: General Surgery

## 2018-01-03 ENCOUNTER — Encounter (HOSPITAL_COMMUNITY)
Admission: EM | Disposition: A | Discharge status: Home / Self Care | Payer: Self-pay | Source: Home / Self Care | Attending: Emergency Medicine

## 2018-01-03 ENCOUNTER — Emergency Department (HOSPITAL_COMMUNITY): Payer: Managed Care, Other (non HMO)

## 2018-01-03 DIAGNOSIS — Z79899 Other long term (current) drug therapy: Secondary | ICD-10-CM | POA: Diagnosis not present

## 2018-01-03 DIAGNOSIS — K760 Fatty (change of) liver, not elsewhere classified: Secondary | ICD-10-CM | POA: Diagnosis not present

## 2018-01-03 DIAGNOSIS — Z794 Long term (current) use of insulin: Secondary | ICD-10-CM | POA: Diagnosis not present

## 2018-01-03 DIAGNOSIS — I700 Atherosclerosis of aorta: Secondary | ICD-10-CM | POA: Diagnosis not present

## 2018-01-03 DIAGNOSIS — K3580 Unspecified acute appendicitis: Secondary | ICD-10-CM | POA: Diagnosis present

## 2018-01-03 DIAGNOSIS — K353 Acute appendicitis with localized peritonitis, without perforation or gangrene: Secondary | ICD-10-CM | POA: Diagnosis not present

## 2018-01-03 DIAGNOSIS — E109 Type 1 diabetes mellitus without complications: Secondary | ICD-10-CM | POA: Diagnosis not present

## 2018-01-03 HISTORY — PX: LAPAROSCOPIC APPENDECTOMY: SHX408

## 2018-01-03 HISTORY — DX: Age-related osteoporosis without current pathological fracture: M81.0

## 2018-01-03 LAB — CBC
HCT: 42.4 % (ref 39.0–52.0)
HCT: 41.3 % (ref 39.0–52.0)
HEMOGLOBIN: 14.5 g/dL (ref 13.0–17.0)
HEMOGLOBIN: 14.1 g/dL (ref 13.0–17.0)
MCH: 33.3 pg (ref 26.0–34.0)
MCH: 33.6 pg (ref 26.0–34.0)
MCHC: 34.2 g/dL (ref 30.0–36.0)
MCHC: 34.1 g/dL (ref 30.0–36.0)
MCV: 97.5 fL (ref 78.0–100.0)
MCV: 98.3 fL (ref 78.0–100.0)
PLATELETS: 193 10*3/uL (ref 150–400)
Platelets: 197 10*3/uL (ref 150–400)
RBC: 4.35 MIL/uL (ref 4.22–5.81)
RBC: 4.2 MIL/uL — AB (ref 4.22–5.81)
RDW: 13.5 % (ref 11.5–15.5)
RDW: 13.8 % (ref 11.5–15.5)
WBC: 15.1 10*3/uL — ABNORMAL HIGH (ref 4.0–10.5)
WBC: 14.9 10*3/uL — ABNORMAL HIGH (ref 4.0–10.5)

## 2018-01-03 LAB — URINALYSIS, ROUTINE W REFLEX MICROSCOPIC
BILIRUBIN URINE: NEGATIVE
Glucose, UA: >=500 mg/dL — AB
KETONES UR: 80 mg/dL — AB
Leukocytes, UA: NEGATIVE
Nitrite: NEGATIVE
PH: 5 (ref 5.0–8.0)
PROTEIN: NEGATIVE mg/dL
Specific Gravity, Urine: >1.046 — ABNORMAL HIGH (ref 1.005–1.030)

## 2018-01-03 LAB — COMPREHENSIVE METABOLIC PANEL
ALBUMIN: 4.2 g/dL (ref 3.5–5.0)
ALK PHOS: 61 U/L (ref 38–126)
ALT: 38 U/L (ref 17–63)
ANION GAP: 12 (ref 5–15)
AST: 47 U/L — ABNORMAL HIGH (ref 15–41)
BUN: 10 mg/dL (ref 6–20)
CALCIUM: 9.6 mg/dL (ref 8.9–10.3)
CO2: 25 mmol/L (ref 22–32)
CREATININE: 1.33 mg/dL — AB (ref 0.61–1.24)
Chloride: 99 mmol/L — ABNORMAL LOW (ref 101–111)
GFR calc Af Amer: >60 mL/min (ref >60)
GFR calc non Af Amer: 58 mL/min — ABNORMAL LOW (ref >60)
GLUCOSE: 172 mg/dL — AB (ref 65–99)
Potassium: 3.9 mmol/L (ref 3.5–5.1)
Sodium: 136 mmol/L (ref 135–145)
Total Bilirubin: 1.4 mg/dL — ABNORMAL HIGH (ref 0.3–1.2)
Total Protein: 7.6 g/dL (ref 6.5–8.1)

## 2018-01-03 LAB — CBG MONITORING, ED: GLUCOSE-CAPILLARY: 162 mg/dL — AB (ref 65–99)

## 2018-01-03 LAB — GLUCOSE, CAPILLARY
GLUCOSE-CAPILLARY: 206 mg/dL — AB (ref 65–99)
GLUCOSE-CAPILLARY: 140 mg/dL — AB (ref 65–99)
Glucose-Capillary: 120 mg/dL — ABNORMAL HIGH (ref 65–99)
Glucose-Capillary: 138 mg/dL — ABNORMAL HIGH (ref 65–99)

## 2018-01-03 LAB — CREATININE, SERUM
Creatinine, Ser: 1.18 mg/dL (ref 0.61–1.24)
GFR calc Af Amer: >60 mL/min (ref >60)
GFR calc non Af Amer: >60 mL/min (ref >60)

## 2018-01-03 LAB — LIPASE, BLOOD: LIPASE: 21 U/L (ref 11–51)

## 2018-01-03 SURGERY — APPENDECTOMY, LAPAROSCOPIC
Anesthesia: General | Site: Abdomen

## 2018-01-03 MED ORDER — HYDRALAZINE HCL 20 MG/ML IJ SOLN: 10.0000 mg | INTRAMUSCULAR | Status: DC | PRN

## 2018-01-03 MED ORDER — ROCURONIUM BROMIDE 10 MG/ML (PF) SYRINGE
PREFILLED_SYRINGE | INTRAVENOUS | Status: AC
  Filled 2018-01-03: qty 5

## 2018-01-03 MED ORDER — DOCUSATE SODIUM 100 MG PO CAPS
100.0000 mg | ORAL_CAPSULE | Freq: Two times a day (BID) | ORAL | Status: DC
  Administered 2018-01-03: 100 mg via ORAL
  Filled 2018-01-03: qty 1

## 2018-01-03 MED ORDER — EPHEDRINE 5 MG/ML INJ
INTRAVENOUS | Status: AC
  Filled 2018-01-03: qty 10

## 2018-01-03 MED ORDER — TRAMADOL HCL 50 MG PO TABS: 50.0000 mg | ORAL_TABLET | Freq: Four times a day (QID) | ORAL | Status: DC | PRN

## 2018-01-03 MED ORDER — POTASSIUM CHLORIDE IN NACL 20-0.45 MEQ/L-% IV SOLN
INTRAVENOUS | Status: DC
  Administered 2018-01-03 – 2018-01-04 (×2): via INTRAVENOUS
  Filled 2018-01-03 (×2): qty 1000

## 2018-01-03 MED ORDER — LACTATED RINGERS IV SOLN
INTRAVENOUS | Status: DC
  Administered 2018-01-03: 12:00:00 via INTRAVENOUS

## 2018-01-03 MED ORDER — SODIUM CHLORIDE 0.9 % IR SOLN
Status: DC | PRN
  Administered 2018-01-03: 1000 mL

## 2018-01-03 MED ORDER — FENTANYL CITRATE (PF) 250 MCG/5ML IJ SOLN
INTRAMUSCULAR | Status: AC
  Filled 2018-01-03: qty 5

## 2018-01-03 MED ORDER — POTASSIUM CHLORIDE IN NACL 20-0.45 MEQ/L-% IV SOLN: INTRAVENOUS | Status: DC

## 2018-01-03 MED ORDER — DEXAMETHASONE SODIUM PHOSPHATE 10 MG/ML IJ SOLN
INTRAMUSCULAR | Status: AC
  Filled 2018-01-03: qty 1

## 2018-01-03 MED ORDER — INSULIN ASPART 100 UNIT/ML ~~LOC~~ SOLN: 0.0000 [IU] | Freq: Three times a day (TID) | SUBCUTANEOUS | Status: DC

## 2018-01-03 MED ORDER — ROCURONIUM BROMIDE 100 MG/10ML IV SOLN
INTRAVENOUS | Status: DC | PRN
  Administered 2018-01-03: 40 mg via INTRAVENOUS

## 2018-01-03 MED ORDER — PANTOPRAZOLE SODIUM 40 MG IV SOLR: 40.0000 mg | Freq: Every day | INTRAVENOUS | Status: DC

## 2018-01-03 MED ORDER — GABAPENTIN 300 MG PO CAPS
300.0000 mg | ORAL_CAPSULE | Freq: Two times a day (BID) | ORAL | Status: DC
  Administered 2018-01-03: 300 mg via ORAL
  Filled 2018-01-03: qty 1

## 2018-01-03 MED ORDER — 0.9 % SODIUM CHLORIDE (POUR BTL) OPTIME
TOPICAL | Status: DC | PRN
  Administered 2018-01-03: 1000 mL

## 2018-01-03 MED ORDER — HYDROMORPHONE HCL 1 MG/ML IJ SOLN: 0.5000 mg | INTRAMUSCULAR | Status: DC | PRN

## 2018-01-03 MED ORDER — PHENYLEPHRINE 40 MCG/ML (10ML) SYRINGE FOR IV PUSH (FOR BLOOD PRESSURE SUPPORT)
PREFILLED_SYRINGE | INTRAVENOUS | Status: AC
  Filled 2018-01-03: qty 10

## 2018-01-03 MED ORDER — PROPOFOL 10 MG/ML IV BOLUS
INTRAVENOUS | Status: DC | PRN
  Administered 2018-01-03: 40 mg via INTRAVENOUS
  Administered 2018-01-03: 160 mg via INTRAVENOUS

## 2018-01-03 MED ORDER — CELECOXIB 200 MG PO CAPS
400.0000 mg | ORAL_CAPSULE | ORAL | Status: AC
  Administered 2018-01-03: 400 mg via ORAL

## 2018-01-03 MED ORDER — LIDOCAINE 2% (20 MG/ML) 5 ML SYRINGE
INTRAMUSCULAR | Status: DC | PRN
  Administered 2018-01-03: 100 mg via INTRAVENOUS

## 2018-01-03 MED ORDER — ONDANSETRON HCL 4 MG/2ML IJ SOLN: 4.0000 mg | Freq: Four times a day (QID) | INTRAMUSCULAR | Status: DC | PRN

## 2018-01-03 MED ORDER — INSULIN NPH (HUMAN) (ISOPHANE) 100 UNIT/ML ~~LOC~~ SUSP
5.0000 [IU] | Freq: Every day | SUBCUTANEOUS | Status: DC
  Administered 2018-01-03: 5 [IU] via SUBCUTANEOUS
  Filled 2018-01-03: qty 10

## 2018-01-03 MED ORDER — HYDROCODONE-ACETAMINOPHEN 5-325 MG PO TABS
1.0000 | ORAL_TABLET | ORAL | Status: DC | PRN
  Filled 2018-01-03: qty 2

## 2018-01-03 MED ORDER — BUPIVACAINE-EPINEPHRINE 0.5% -1:200000 IJ SOLN
INTRAMUSCULAR | Status: DC | PRN
  Administered 2018-01-03: 20 mL

## 2018-01-03 MED ORDER — IOPAMIDOL (ISOVUE-300) INJECTION 61%
INTRAVENOUS | Status: AC
  Administered 2018-01-03: 100 mL
  Filled 2018-01-03: qty 100

## 2018-01-03 MED ORDER — METRONIDAZOLE IN NACL 5-0.79 MG/ML-% IV SOLN
500.0000 mg | Freq: Three times a day (TID) | INTRAVENOUS | Status: DC
  Administered 2018-01-03 – 2018-01-04 (×3): 500 mg via INTRAVENOUS
  Filled 2018-01-03 (×4): qty 100

## 2018-01-03 MED ORDER — ONDANSETRON 4 MG PO TBDP: 4.0000 mg | ORAL_TABLET | Freq: Four times a day (QID) | ORAL | Status: DC | PRN

## 2018-01-03 MED ORDER — INSULIN ASPART 100 UNIT/ML ~~LOC~~ SOLN
0.0000 [IU] | Freq: Three times a day (TID) | SUBCUTANEOUS | Status: DC
  Administered 2018-01-03: 3 [IU] via SUBCUTANEOUS
  Administered 2018-01-04: 2 [IU] via SUBCUTANEOUS

## 2018-01-03 MED ORDER — SCOPOLAMINE 1 MG/3DAYS TD PT72
MEDICATED_PATCH | TRANSDERMAL | Status: AC
  Administered 2018-01-03: 1.5 mg via TRANSDERMAL
  Filled 2018-01-03: qty 1

## 2018-01-03 MED ORDER — INSULIN ASPART 100 UNIT/ML ~~LOC~~ SOLN
5.0000 [IU] | Freq: Two times a day (BID) | SUBCUTANEOUS | Status: DC
  Administered 2018-01-03 – 2018-01-04 (×2): 5 [IU] via SUBCUTANEOUS

## 2018-01-03 MED ORDER — INSULIN ASPART 100 UNIT/ML ~~LOC~~ SOLN: 0.0000 [IU] | Freq: Every day | SUBCUTANEOUS | Status: DC

## 2018-01-03 MED ORDER — MORPHINE SULFATE (PF) 4 MG/ML IV SOLN
2.0000 mg | Freq: Once | INTRAVENOUS | Status: AC
  Administered 2018-01-03: 2 mg via INTRAVENOUS
  Filled 2018-01-03: qty 1

## 2018-01-03 MED ORDER — ENOXAPARIN SODIUM 40 MG/0.4ML ~~LOC~~ SOLN: 40.0000 mg | SUBCUTANEOUS | Status: DC

## 2018-01-03 MED ORDER — MIDAZOLAM HCL 5 MG/5ML IJ SOLN
INTRAMUSCULAR | Status: DC | PRN
  Administered 2018-01-03: 2 mg via INTRAVENOUS

## 2018-01-03 MED ORDER — ACETAMINOPHEN 500 MG PO TABS
1000.0000 mg | ORAL_TABLET | ORAL | Status: AC
  Administered 2018-01-03: 1000 mg via ORAL

## 2018-01-03 MED ORDER — CELECOXIB 200 MG PO CAPS
ORAL_CAPSULE | ORAL | Status: AC
  Administered 2018-01-03: 400 mg via ORAL
  Filled 2018-01-03: qty 2

## 2018-01-03 MED ORDER — DEXTROSE 5 % IV SOLN
2.0000 g | INTRAVENOUS | Status: DC
  Filled 2018-01-03: qty 2

## 2018-01-03 MED ORDER — GABAPENTIN 300 MG PO CAPS
ORAL_CAPSULE | ORAL | Status: AC
  Administered 2018-01-03: 300 mg via ORAL
  Filled 2018-01-03: qty 1

## 2018-01-03 MED ORDER — DEXAMETHASONE SODIUM PHOSPHATE 4 MG/ML IJ SOLN
INTRAMUSCULAR | Status: DC | PRN
  Administered 2018-01-03: 8 mg via INTRAVENOUS

## 2018-01-03 MED ORDER — PANTOPRAZOLE SODIUM 40 MG IV SOLR
40.0000 mg | Freq: Every day | INTRAVENOUS | Status: DC
  Administered 2018-01-03: 40 mg via INTRAVENOUS
  Filled 2018-01-03: qty 40

## 2018-01-03 MED ORDER — ACETAMINOPHEN 500 MG PO TABS
ORAL_TABLET | ORAL | Status: AC
  Administered 2018-01-03: 1000 mg via ORAL
  Filled 2018-01-03: qty 2

## 2018-01-03 MED ORDER — MORPHINE SULFATE (PF) 4 MG/ML IV SOLN
2.0000 mg | INTRAVENOUS | Status: DC | PRN
  Administered 2018-01-03: 4 mg via INTRAVENOUS
  Filled 2018-01-03 (×2): qty 1

## 2018-01-03 MED ORDER — ACETAMINOPHEN 325 MG PO TABS: 650.0000 mg | ORAL_TABLET | Freq: Four times a day (QID) | ORAL | Status: DC | PRN

## 2018-01-03 MED ORDER — CEFTRIAXONE SODIUM 1 G IJ SOLR
1.0000 g | INTRAMUSCULAR | Status: AC
  Administered 2018-01-03: 1 g via INTRAVENOUS
  Filled 2018-01-03 (×2): qty 10

## 2018-01-03 MED ORDER — ONDANSETRON HCL 4 MG/2ML IJ SOLN
INTRAMUSCULAR | Status: DC | PRN
  Administered 2018-01-03: 4 mg via INTRAVENOUS

## 2018-01-03 MED ORDER — ACETAMINOPHEN 650 MG RE SUPP: 650.0000 mg | Freq: Four times a day (QID) | RECTAL | Status: DC | PRN

## 2018-01-03 MED ORDER — FENTANYL CITRATE (PF) 100 MCG/2ML IJ SOLN
INTRAMUSCULAR | Status: DC | PRN
  Administered 2018-01-03: 50 ug via INTRAVENOUS
  Administered 2018-01-03: 150 ug via INTRAVENOUS
  Administered 2018-01-03 (×2): 50 ug via INTRAVENOUS

## 2018-01-03 MED ORDER — SUCCINYLCHOLINE CHLORIDE 200 MG/10ML IV SOSY
PREFILLED_SYRINGE | INTRAVENOUS | Status: AC
  Filled 2018-01-03: qty 10

## 2018-01-03 MED ORDER — SCOPOLAMINE 1 MG/3DAYS TD PT72
1.0000 | MEDICATED_PATCH | TRANSDERMAL | Status: DC
  Administered 2018-01-03: 1.5 mg via TRANSDERMAL

## 2018-01-03 MED ORDER — LACTATED RINGERS IV SOLN
INTRAVENOUS | Status: DC | PRN
  Administered 2018-01-03 (×2): via INTRAVENOUS

## 2018-01-03 MED ORDER — MIDAZOLAM HCL 2 MG/2ML IJ SOLN
INTRAMUSCULAR | Status: AC
  Filled 2018-01-03: qty 2

## 2018-01-03 MED ORDER — CELECOXIB 200 MG PO CAPS
200.0000 mg | ORAL_CAPSULE | Freq: Two times a day (BID) | ORAL | Status: DC
  Administered 2018-01-03: 200 mg via ORAL
  Filled 2018-01-03: qty 1

## 2018-01-03 MED ORDER — INSULIN NPH (HUMAN) (ISOPHANE) 100 UNIT/ML ~~LOC~~ SUSP
8.0000 [IU] | Freq: Every day | SUBCUTANEOUS | Status: DC
  Administered 2018-01-04: 8 [IU] via SUBCUTANEOUS
  Filled 2018-01-03: qty 10

## 2018-01-03 MED ORDER — GABAPENTIN 300 MG PO CAPS
300.0000 mg | ORAL_CAPSULE | ORAL | Status: AC
  Administered 2018-01-03: 300 mg via ORAL

## 2018-01-03 MED ORDER — PROPOFOL 10 MG/ML IV BOLUS
INTRAVENOUS | Status: AC
  Filled 2018-01-03: qty 20

## 2018-01-03 MED ORDER — SUGAMMADEX SODIUM 200 MG/2ML IV SOLN
INTRAVENOUS | Status: DC | PRN
  Administered 2018-01-03: 200 mg via INTRAVENOUS

## 2018-01-03 MED ORDER — ONDANSETRON HCL 4 MG/2ML IJ SOLN
INTRAMUSCULAR | Status: AC
  Filled 2018-01-03: qty 2

## 2018-01-03 MED ORDER — SUGAMMADEX SODIUM 200 MG/2ML IV SOLN
INTRAVENOUS | Status: AC
  Filled 2018-01-03: qty 2

## 2018-01-03 MED ORDER — POLYETHYLENE GLYCOL 3350 17 G PO PACK: 17.0000 g | PACK | Freq: Every day | ORAL | Status: DC | PRN

## 2018-01-03 SURGICAL SUPPLY — 37 items
APPLIER CLIP 5 13 M/L LIGAMAX5 (MISCELLANEOUS)
CANISTER SUCT 3000ML PPV (MISCELLANEOUS) ×3 IMPLANT
CHLORAPREP W/TINT 26ML (MISCELLANEOUS) ×3 IMPLANT
CLIP APPLIE 5 13 M/L LIGAMAX5 (MISCELLANEOUS) IMPLANT
CLIP VESOLOCK XL 6/CT (CLIP) ×3 IMPLANT
COVER SURGICAL LIGHT HANDLE (MISCELLANEOUS) ×3 IMPLANT
DERMABOND ADVANCED (GAUZE/BANDAGES/DRESSINGS) ×2
DERMABOND ADVANCED .7 DNX12 (GAUZE/BANDAGES/DRESSINGS) ×1 IMPLANT
DEVICE PMI PUNCTURE CLOSURE (MISCELLANEOUS) ×3 IMPLANT
ELECT REM PT RETURN 9FT ADLT (ELECTROSURGICAL) ×3
ELECTRODE REM PT RTRN 9FT ADLT (ELECTROSURGICAL) ×1 IMPLANT
ENDOLOOP SUT PDS II  0 18 (SUTURE)
ENDOLOOP SUT PDS II 0 18 (SUTURE) IMPLANT
GLOVE BIOGEL PI IND STRL 7.0 (GLOVE) ×1 IMPLANT
GLOVE BIOGEL PI INDICATOR 7.0 (GLOVE) ×2
GLOVE SURG SS PI 7.0 STRL IVOR (GLOVE) ×3 IMPLANT
GOWN STRL REUS W/ TWL LRG LVL3 (GOWN DISPOSABLE) ×3 IMPLANT
GOWN STRL REUS W/TWL LRG LVL3 (GOWN DISPOSABLE) ×6
KIT BASIN OR (CUSTOM PROCEDURE TRAY) ×3 IMPLANT
KIT ROOM TURNOVER OR (KITS) ×3 IMPLANT
NEEDLE 22X1 1/2 (OR ONLY) (NEEDLE) ×3 IMPLANT
NS IRRIG 1000ML POUR BTL (IV SOLUTION) ×3 IMPLANT
PAD ARMBOARD 7.5X6 YLW CONV (MISCELLANEOUS) ×6 IMPLANT
POUCH RETRIEVAL ECOSAC 10 (ENDOMECHANICALS) ×1 IMPLANT
POUCH RETRIEVAL ECOSAC 10MM (ENDOMECHANICALS) ×2
SCISSORS LAP 5X35 DISP (ENDOMECHANICALS) ×3 IMPLANT
SET IRRIG TUBING LAPAROSCOPIC (IRRIGATION / IRRIGATOR) ×3 IMPLANT
SPECIMEN JAR SMALL (MISCELLANEOUS) ×3 IMPLANT
SUT MNCRL AB 4-0 PS2 18 (SUTURE) ×3 IMPLANT
TOWEL OR 17X24 6PK STRL BLUE (TOWEL DISPOSABLE) ×3 IMPLANT
TOWEL OR 17X26 10 PK STRL BLUE (TOWEL DISPOSABLE) ×3 IMPLANT
TRAY FOLEY CATH SILVER 16FR (SET/KITS/TRAYS/PACK) IMPLANT
TRAY LAPAROSCOPIC MC (CUSTOM PROCEDURE TRAY) ×3 IMPLANT
TROCAR XCEL NON-BLD 11X100MML (ENDOMECHANICALS) ×3 IMPLANT
TROCAR XCEL NON-BLD 5MMX100MML (ENDOMECHANICALS) ×6 IMPLANT
TUBING INSUFFLATION (TUBING) ×3 IMPLANT
WATER STERILE IRR 1000ML POUR (IV SOLUTION) ×3 IMPLANT

## 2018-01-03 NOTE — Op Note (Signed)
Preoperative diagnosis: acute appendicitis with peritonitis  Postoperative diagnosis: Same   Procedure: laparoscopic appendectomy  Surgeon: Feliciana RossettiLuke Oshea Percival, M.D.  Asst: kelly Rayburn  Anesthesia: Gen.   Indications for procedure: Eddie DarbyChristopher W Tregre is a 57 y.o. male with symptoms of vague pain  and pain in right lower quadrant consistent with acute appendicitis. Confirmed by CT scan and laboratory values.  Description of procedure: The patient was brought into the operative suite, placed supine. Anesthesia was administered with endotracheal tube. The patient's left arm was tucked. All pressure points were offloaded by foam padding. The patient was prepped and draped in the usual sterile fashion.  A transverse incision was made to the left of the umbilicus and a 5mm trocar was us. Pneumoperitoneum was applied with high flow low pressure.  2 5mm trocars were placed, one in the suprapubic space, one in the RLQ, the periumbilical incision was then up-sized and a 11mm trocar placed in that space. All trocars sites were first anesthesized with marcaine. Next the patient was placed in trendelenberg, rotated to the left. The omentum was retracted cephalad. The cecum and appendix were identified. The appendix was retrocecal in location. The white line of Toldt was incised and blunt dissection was used to mobilize the appendix medially.. The base of the appendix was dissected and a window through the mesoappendix was created with blunt dissection. Large Hem-o-lock clips were used to doubly ligate the base of the appendix and mesoappendix. The appendix was cut free with scissors.  The appendix was placed in a specimen bag. The pelvis and RLQ were irrigated. The appendix was removed via the umbilicus. 0 vicryl was used to close the fascial defect. Pneumoperitoneum was removed, all trocars were removed. All incisions were closed with 4-0 monocryl subcuticular stitch. The patient woke from anesthesia and was  brought to PACU in stable condition.  Findings: gangrenous appendicitis in retrocecal location  Specimen: appendix  Blood loss: 30 ml  Local anesthesia: 20 ml marcaine  Complications: none  Feliciana RossettiLuke Adley Mazurowski, M.D. General, Bariatric, & Minimally Invasive Surgery [facility]

## 2018-01-03 NOTE — Discharge Instructions (Addendum)
Please arrive at least 30 min before your appointment to complete your check in paperwork.  If you are unable to arrive 30 min prior to your appointment time we may have to cancel or reschedule you. ° °LAPAROSCOPIC SURGERY: POST OP INSTRUCTIONS  °1. DIET: Follow a light bland diet the first 24 hours after arrival home, such as soup, liquids, crackers, etc. Be sure to include lots of fluids daily. Avoid fast food or heavy meals as your are more likely to get nauseated. Eat a low fat the next few days after surgery.  °2. Take your usually prescribed home medications unless otherwise directed. °3. PAIN CONTROL:  °1. Pain is best controlled by a usual combination of three different methods TOGETHER:  °1. Ice/Heat °2. Over the counter pain medication °3. Prescription pain medication °2. Most patients will experience some swelling and bruising around the incisions. Ice packs or heating pads (30-60 minutes up to 6 times a day) will help. Use ice for the first few days to help decrease swelling and bruising, then switch to heat to help relax tight/sore spots and speed recovery. Some people prefer to use ice alone, heat alone, alternating between ice & heat. Experiment to what works for you. Swelling and bruising can take several weeks to resolve.  °3. It is helpful to take an over-the-counter pain medication regularly for the first few weeks. Choose one of the following that works best for you:  °1. Naproxen (Aleve, etc) Two 220mg tabs twice a day °2. Ibuprofen (Advil, etc) Three 200mg tabs four times a day (every meal & bedtime) °3. Acetaminophen (Tylenol, etc) 500-650mg four times a day (every meal & bedtime) °4. A prescription for pain medication (such as oxycodone, hydrocodone, etc) should be given to you upon discharge. Take your pain medication as prescribed.  °1. If you are having problems/concerns with the prescription medicine (does not control pain, nausea, vomiting, rash, itching, etc), please call us [phone] to see if we need to switch you to a different pain medicine that will work better for you and/or control your side effect better. °2. If you need a refill on your pain medication, please contact your pharmacy. They will contact our office to request authorization. Prescriptions will not be filled after 5 pm or on week-ends. °4. Avoid getting constipated. Between the surgery and the pain medications, it is common to experience some constipation. Increasing fluid intake and taking a fiber supplement (such as Metamucil, Citrucel, FiberCon, MiraLax, etc) 1-2 times a day regularly will usually help prevent this problem from occurring. A mild laxative (prune juice, Milk of Magnesia, MiraLax, etc) should be taken according to package directions if there are no bowel movements after 48 hours.  °5. Watch out for diarrhea. If you have many loose bowel movements, simplify your diet to bland foods & liquids for a few days. Stop any stool softeners and decrease your fiber supplement. Switching to mild anti-diarrheal medications (Kayopectate, Pepto Bismol) can help. If this worsens or does not improve, please call us. °6. Wash / shower every day. You may shower over the dressings as they are waterproof. Continue to shower over incision(s) after the dressing is off. If there is glue over the incisions try not to pick it off, let it fall off naturally. °7. Remove your waterproof bandages 2 days after surgery. You may leave the incision open to air. You may replace a dressing/Band-Aid to cover the incision for comfort if you wish.  °8. ACTIVITIES as tolerated:  °  1. You may resume regular (light) daily activities beginning the next day--such as daily self-care, walking, climbing stairs--gradually increasing activities as tolerated. If you can walk 30 minutes without difficulty, it is safe to try more intense activity such as jogging, treadmill, bicycling, low-impact aerobics, swimming, etc. 2. Save the most intensive and  strenuous activity for last such as sit-ups, heavy lifting, contact sports, etc Refrain from any heavy lifting or straining until you are off narcotics for pain control. For the first 2-3 weeks do not lift over 10-15lb.  3. DO NOT PUSH THROUGH PAIN. Let pain be your guide: If it hurts to do something, don't do it. Pain is your body warning you to avoid that activity for another week until the pain goes down. 4. You may drive when you are no longer taking prescription pain medication, you can comfortably wear a seatbelt, and you can safely maneuver your car and apply brakes. 5. You may have sexual intercourse when it is comfortable.  9. FOLLOW UP in our office  1. Please call [facility] at [phone] to set up an appointment to see your surgeon in the office for a follow-up appointment approximately 2-3 weeks after your surgery. 2. Make sure that you call for this appointment the day you arrive home to insure a convenient appointment time.      10. IF YOU HAVE DISABILITY OR FAMILY LEAVE FORMS, BRING THEM TO THE               OFFICE FOR PROCESSING.   WHEN TO CALL US [phone]:  1. Poor pain control 2. Reactions / problems with new medications (rash/itching, nausea, etc)  3. Fever over 101.5 F (38.5 C) 4. Inability to urinate 5. Nausea and/or vomiting 6. Worsening swelling or bruising 7. Continued bleeding from incision. 8. Increased pain, redness, or drainage from the incision  The clinic staff is available to answer your questions during regular business hours (8:30am-5pm). Please dont hesitate to call and ask to speak to one of our nurses for clinical concerns.  If you have a medical emergency, go to the nearest emergency room or call 911.  [facility] is always on call at the [facility]   [facility] [place]  [address], [address], [place], [place] [address] ?  MAIN: [phone] ? TOLL FREE: [phone] ?  FAX [phone]    [email or web address]  Soft-Food Meal Plan Follow for 3-5 days post-op A soft-food meal plan includes foods that are safe and easy to swallow. This meal plan typically is used:  If you are having trouble chewing or swallowing foods.  As a transition meal plan after only having had liquid meals for a long period.  What do I need to know about the soft-food meal plan? A soft-food meal plan includes tender foods that are soft and easy to chew and swallow. In most cases, bite-sized pieces of food are easier to swallow. A bite-sized piece is about  inch or smaller. Foods in this plan do not need to be ground or pureed. Foods that are very hard, crunchy, or sticky should be avoided. Also, breads, cereals, yogurts, and desserts with nuts, seeds, or fruits should be avoided. What foods can I eat? Grains Rice and wild rice. Moist bread, dressing, pasta, and noodles. Well-moistened dry or cooked cereals, such as farina (cooked wheat cereal), oatmeal, or grits. Biscuits, breads, muffins, pancakes, and waffles that have been well moistened. Vegetables Shredded lettuce. Cooked, tender  vegetables, including potatoes without skins. Vegetable juices. Broths or creamed soups made with vegetables that are not stringy or chewy. Strained tomatoes (without seeds). Fruits Canned or well-cooked fruits. Soft (ripe), peeled fresh fruits, such as peaches, nectarines, kiwi, cantaloupe, honeydew melon, and watermelon (without seeds). Soft berries with small seeds, such as strawberries. Fruit juices (without pulp). Meats and Other Protein Sources Moist, tender, lean beef. Mutton. Lamb. Veal. Chicken. [place]. Liver. Ham. Fish without bones. Eggs. Dairy Milk, milk drinks, and cream. Plain cream cheese and cottage cheese. Plain yogurt. Sweets/Desserts Flavored gelatin desserts. Custard. Plain ice cream, frozen yogurt, sherbet, milk shakes, and malts. Plain cakes and cookies. Plain hard candy. Other Butter,  margarine (without trans fat), and cooking oils. Mayonnaise. Cream sauces. Mild spices, salt, and sugar. Syrup, molasses, honey, and jelly. The items listed above may not be a complete list of recommended foods or beverages. Contact your dietitian for more options. What foods are not recommended? Grains Dry bread, toast, crackers that have not been moistened. Coarse or dry cereals, such as bran, granola, and shredded wheat. Tough or chewy crusty breads, such as [place] bread or baguettes. Vegetables Corn. Raw vegetables except shredded lettuce. Cooked vegetables that are tough or stringy. Tough, crisp, fried potatoes and potato skins. Fruits Fresh fruits with skins or seeds or both, such as apples, pears, or grapes. Stringy, high-pulp fruits, such as papaya, pineapple, coconut, or mango. Fruit leather, fruit roll-ups, and all dried fruits. Meats and Other Protein Sources Sausages and hot dogs. Meats with gristle. Fish with bones. Nuts, seeds, and chunky peanut or other nut butters. Sweets/Desserts Cakes or cookies that are very dry or chewy. The items listed above may not be a complete list of foods and beverages to avoid. Contact your dietitian for more information. This information is not intended to replace advice given to you by your health care provider. Make sure you discuss any questions you have with your health care provider. Document Released: 02/27/2008 Document Revised: 04/27/2016 Document Reviewed: 10/17/2013 Elsevier Interactive Patient Education  2017 [employer].

## 2018-01-03 NOTE — ED Notes (Signed)
Report given to Tameka, RN.

## 2018-01-03 NOTE — Transfer of Care (Signed)
Immediate Anesthesia Transfer of Care Note  Patient: Eddie DarbyChristopher W Ganas  Procedure(s) Performed: LAPAROSCOPIC APPENDECTOMY (N/A Abdomen)  Patient Location: PACU  Anesthesia Type:General  Level of Consciousness: awake, oriented and patient cooperative  Airway & Oxygen Therapy: Patient Spontanous Breathing and Patient connected to nasal cannula oxygen  Post-op Assessment: Report given to RN and Post -op Vital signs reviewed and stable  Post vital signs: Reviewed and stable  Last Vitals:  Vitals:   01/03/18 1015 01/03/18 1030  BP: 128/76 127/77  Pulse: 70 70  Resp:    Temp:    SpO2: 97% 97%    Last Pain:  Vitals:   01/03/18 1109  TempSrc:   PainSc: 10-Worst pain ever         Complications: No apparent anesthesia complications

## 2018-01-03 NOTE — Anesthesia Preprocedure Evaluation (Signed)
Anesthesia Evaluation  Patient identified by MRN, date of birth, ID band Patient awake    Reviewed: Allergy & Precautions, H&P , Patient's Chart, lab work & pertinent test results, reviewed documented beta blocker date and time   Airway Mallampati: II  TM Distance: >3 FB Neck ROM: full    Dental no notable dental hx.    Pulmonary    Pulmonary exam normal breath sounds clear to auscultation       Cardiovascular  Rhythm:regular Rate:Normal     Neuro/Psych    GI/Hepatic   Endo/Other  diabetes, Well Controlled, Type 1  Renal/GU      Musculoskeletal   Abdominal   Peds  Hematology   Anesthesia Other Findings   Reproductive/Obstetrics                             Anesthesia Physical Anesthesia Plan  ASA: III  Anesthesia Plan: General   Post-op Pain Management:    Induction: Intravenous  PONV Risk Score and Plan: 2 and Treatment may vary due to age or medical condition, Dexamethasone and Ondansetron  Airway Management Planned: Oral ETT  Additional Equipment:   Intra-op Plan:   Post-operative Plan: Extubation in OR  Informed Consent: I have reviewed the patients History and Physical, chart, labs and discussed the procedure including the risks, benefits and alternatives for the proposed anesthesia with the patient or authorized representative who has indicated his/her understanding and acceptance.   Dental Advisory Given  Plan Discussed with: CRNA and Surgeon  Anesthesia Plan Comments: (  )        Anesthesia Quick Evaluation

## 2018-01-03 NOTE — H&P (Signed)
[facility] Admission Note  Eddie Vang March 22, 1961  [id number].    Requesting MD:  Chief Complaint/Reason for Consult: Abdominal pain  HPI:  Patient is a 57 y/o male who presented to [facility] with right sided abdominal pain since yesterday around 1400. Pain is sharp, constant and has progressively worsened. Patient states pain is in mid to low right abdomen and does not radiate. Denies fevers/chills, n/v, diarrhea, constipation, urinary symptoms. No chest pain, SOB. Last BM yesterday afternoon, last meal yesterday. Patient PMH significant for type 1 diabetes well controlled on insulin. Surgical hx includes open bilateral inguinal hernia repair with mesh. No blood thinning medications. No allergies to antibiotics. Patient denies alcohol, illicit drugs, and tobacco use.   ROS: Review of Systems  Constitutional: Negative for chills and fever.  Respiratory: Negative for shortness of breath.   Cardiovascular: Negative for chest pain and palpitations.  Gastrointestinal: Positive for abdominal pain. Negative for blood in stool, constipation, diarrhea, nausea and vomiting.  Genitourinary: Negative for dysuria, frequency and urgency.  All other systems reviewed and are negative.   No family history on file.  Past Medical History:  Diagnosis Date  . Diabetes mellitus     Past Surgical History:  Procedure Laterality Date  . TIBIA IM NAIL INSERTION  06/02/2012   Procedure: INTRAMEDULLARY (IM) NAIL TIBIAL;  Surgeon: Melina Schools, MD;  Location: [place];  Service: Orthopedics;  Laterality: Right;  with ORIF posterior malleolus fracture    Social History:  reports that  has never smoked. he has never used smokeless tobacco. He reports that he does not drink alcohol or use drugs.  Allergies:  Allergies  Allergen Reactions  . Lantus [Insulin Glargine] Other (See Comments)    Blood sugar crashed and body cannot handle it    Medications Prior to Admission  Medication Sig  Dispense Refill  . atorvastatin (LIPITOR) 10 MG tablet Take 10 mg by mouth at bedtime.    . cholecalciferol (VITAMIN D) 1000 UNITS tablet Take 1,000 Units by mouth daily.    Marland Kitchen HYDROcodone-homatropine (HYCODAN) 5-1.5 MG/5ML syrup Take 5 mLs by mouth every 6 (six) hours as needed for cough. 120 mL 0  . insulin NPH (HUMULIN N,NOVOLIN N) 100 UNIT/ML injection Inject 5-8 Units into the skin 2 (two) times daily before a meal. Inject 8 units in the morning and 5 units at night.    . insulin regular (NOVOLIN R,HUMULIN R) 100 units/mL injection Inject 5 Units into the skin 2 (two) times daily before a meal.    . oxyCODONE-acetaminophen (PERCOCET/ROXICET) 5-325 MG per tablet Take 2 tablets by mouth every 4 (four) hours as needed for moderate pain or severe pain. 10 tablet 0    Blood pressure 127/77, pulse 70, temperature 97.9 F (36.6 C), temperature source Oral, resp. rate 20, SpO2 97 %. Physical Exam: Physical Exam  Constitutional: He is oriented to person, place, and time. He appears well-developed and well-nourished. He is cooperative.  Non-toxic appearance. No distress.  HENT:  Head: Normocephalic and atraumatic.  Right Ear: External ear normal.  Left Ear: External ear normal.  Nose: Nose normal.  Mouth/Throat: Oropharynx is clear and moist and mucous membranes are normal.  Eyes: Conjunctivae, EOM and lids are normal. No scleral icterus.  Pupils equal and round  Neck: Normal range of motion and phonation normal. Neck supple.  Cardiovascular: Normal rate and regular rhythm.  Pulses:      Radial pulses are 2+ on the right side, and 2+ on the left side.  Dorsalis pedis pulses are 2+ on the right side, and 2+ on the left side.  No LE edema bilaterally  Pulmonary/Chest: Effort normal and breath sounds normal.  Abdominal: Soft. Bowel sounds are normal. He exhibits no distension. There is no hepatosplenomegaly. There is tenderness in the right upper quadrant and right lower quadrant. There is  no rigidity, no rebound, no guarding, no tenderness at McBurney's point and negative Murphy's sign. No hernia.  Musculoskeletal:  ROM grossly intact in BL upper and lower extremities  Neurological: He is alert and oriented to person, place, and time. He has normal strength. No sensory deficit.  Skin: Skin is warm, dry and intact. He is not diaphoretic. No pallor.  Psychiatric: He has a normal mood and affect. His speech is normal and behavior is normal.    Results for orders placed or performed during the hospital encounter of 01/03/18 (from the past 48 hour(s))  Lipase, blood     Status: None   Collection Time: 01/03/18  6:16 AM  Result Value Ref Range   Lipase 21 11 - 51 U/L  Comprehensive metabolic panel     Status: Abnormal   Collection Time: 01/03/18  6:16 AM  Result Value Ref Range   Sodium 136 135 - 145 mmol/L   Potassium 3.9 3.5 - 5.1 mmol/L   Chloride 99 (L) 101 - 111 mmol/L   CO2 25 22 - 32 mmol/L   Glucose, Bld 172 (H) 65 - 99 mg/dL   BUN 10 6 - 20 mg/dL   Creatinine, Ser 1.33 (H) 0.61 - 1.24 mg/dL   Calcium 9.6 8.9 - 10.3 mg/dL   Total Protein 7.6 6.5 - 8.1 g/dL   Albumin 4.2 3.5 - 5.0 g/dL   AST 47 (H) 15 - 41 U/L   ALT 38 17 - 63 U/L   Alkaline Phosphatase 61 38 - 126 U/L   Total Bilirubin 1.4 (H) 0.3 - 1.2 mg/dL   GFR calc non Af Amer 58 (L) >60 mL/min   GFR calc Af Amer >60 >60 mL/min    Comment: (NOTE) The eGFR has been calculated using the CKD EPI equation. This calculation has not been validated in all clinical situations. eGFR's persistently <60 mL/min signify possible Chronic Kidney Disease.    Anion gap 12 5 - 15  CBC     Status: Abnormal   Collection Time: 01/03/18  6:16 AM  Result Value Ref Range   WBC 15.1 (H) 4.0 - 10.5 K/uL   RBC 4.35 4.22 - 5.81 MIL/uL   Hemoglobin 14.5 13.0 - 17.0 g/dL   HCT 42.4 39.0 - 52.0 %   MCV 97.5 78.0 - 100.0 fL   MCH 33.3 26.0 - 34.0 pg   MCHC 34.2 30.0 - 36.0 g/dL   RDW 13.5 11.5 - 15.5 %   Platelets 197 150 -  400 K/uL  CBG monitoring, ED     Status: Abnormal   Collection Time: 01/03/18  9:00 AM  Result Value Ref Range   Glucose-Capillary 162 (H) 65 - 99 mg/dL   Comment 1 Notify RN    Comment 2 Document in Chart   Urinalysis, Routine w reflex microscopic     Status: Abnormal   Collection Time: 01/03/18 10:57 AM  Result Value Ref Range   Color, Urine YELLOW YELLOW   APPearance CLEAR CLEAR   Specific Gravity, Urine >1.046 (H) 1.005 - 1.030   pH 5.0 5.0 - 8.0   Glucose, UA >=500 (A) NEGATIVE mg/dL   Hgb  urine dipstick SMALL (A) NEGATIVE   Bilirubin Urine NEGATIVE NEGATIVE   Ketones, ur 80 (A) NEGATIVE mg/dL   Protein, ur NEGATIVE NEGATIVE mg/dL   Nitrite NEGATIVE NEGATIVE   Leukocytes, UA NEGATIVE NEGATIVE   RBC / HPF 0-5 0 - 5 RBC/hpf   WBC, UA 0-5 0 - 5 WBC/hpf   Bacteria, UA RARE (A) NONE SEEN   Squamous Epithelial / LPF 0-5 (A) NONE SEEN   Mucus PRESENT    Hyaline Casts, UA PRESENT    Ct Abdomen Pelvis W Contrast  Result Date: 01/03/2018 CLINICAL DATA:  Upper abdominal cramping for 1 day. EXAM: CT ABDOMEN AND PELVIS WITH CONTRAST TECHNIQUE: Multidetector CT imaging of the abdomen and pelvis was performed using the standard protocol following bolus administration of intravenous contrast. CONTRAST:  100 ml ISOVUE-300 IOPAMIDOL (ISOVUE-300) INJECTION 61% COMPARISON:  None. FINDINGS: Lower chest: Heart size is normal. No pleural or pericardial effusion. There is some dependent atelectasis in the lung bases. Coarse calcification projecting over the right hemidiaphragm could be a calcified pleural plaque or large granuloma. Lung bases are otherwise clear. Hepatobiliary: The gallbladder and biliary tree appear normal. The liver is low attenuating consistent with fatty infiltration. Pancreas: Unremarkable. No pancreatic ductal dilatation or surrounding inflammatory changes. Spleen: Normal in size without focal abnormality. Adrenals/Urinary Tract: Nonobstructing renal stones are seen bilaterally.  Dilated calyx in the upper pole the left kidney is incidentally noted. No hydronephrosis or ureteral stones. Urinary bladder is normal appearance. The adrenal glands appear normal. Stomach/Bowel: The appendix is markedly dilated with surrounding stranding consistent with acute appendicitis. There is an appendicoliths in the tip of the appendix measuring 0.9 cm in diameter. A second appendicoliths measuring approximately 1.8 cm long by 0.7 cm transverse is also seen in the distal appendix. The stomach, small bowel and colon appear normal. Vascular/Lymphatic: Aortic atherosclerosis. No enlarged abdominal or pelvic lymph nodes. Reproductive: Prostate is unremarkable. Other: No ascites. Musculoskeletal: Unilateral L5 pars articularis defect on the left is noted. No listhesis. The patient also has a well-marginated lesion in the left femoral neck with an appearance most consistent with an enchondroma. IMPRESSION: The exam is positive for acute appendicitis. Two appendicoliths in the distal appendix are noted. Fatty infiltration of the liver. Nonobstructing bilateral renal stones. Atherosclerosis. 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 These results were called by telephone at the time of interpretation on 01/03/2018 at 10:56 am to [facility] who verbally acknowledged these results. Electronically Signed   By: Inge Rise M.D.   On: 01/03/2018 11:00   US Abdomen Limited  Result Date: 01/03/2018 CLINICAL DATA:  57 year old male with history of right upper quadrant abdominal pain for the past 2 days. EXAM: ULTRASOUND ABDOMEN LIMITED RIGHT UPPER QUADRANT COMPARISON:  No priors. FINDINGS: Gallbladder: No gallstones or wall thickening visualized. No sonographic Murphy sign noted by sonographer. Common bile duct: Diameter: 4 mm Liver: No focal lesion identified. Diffusely increased echogenicity in the hepatic parenchyma, suggesting a background of hepatic  steatosis. Portal vein is patent on color Doppler imaging with normal direction of blood flow towards the liver. IMPRESSION: 1. No acute findings to account for the patient's symptoms. Specifically, no evidence of cholelithiasis or findings to suggest an acute cholecystitis at this time. 2. Mild diffuse increased echogenicity throughout the hepatic parenchyma, concerning for a background of hepatic steatosis. Electronically Signed   By: Vinnie Langton M.D.   On: 01/03/2018 07:58      Assessment/Plan Type 1  Diabetic - SSI post-op  Acute appendicitis - CT: appendix is markedly dilated with surrounding stranding consistent with acute appendicitis, appendicoliths present - RUQ [place] negative for cholelithiasis/cholecystitis - WBC 15.1, afebrile - clinically TTP on the right abdomen   NPO, IVF, IV abx, consent patient. Plan to take to OR for laparoscopic appendectomy.   Brigid Re, [facility] 01/03/2018, 12:18 PM Pager: [phone] Consults: [phone] Mon-Fri 7:00 am-4:30 pm Sat-Sun 7:00 am-11:30 am

## 2018-01-03 NOTE — ED Triage Notes (Signed)
Pt states that he began to have RLQ pain since noon yesterday, some nausea, denies dysuria/fevers

## 2018-01-03 NOTE — ED Notes (Signed)
Patient transported to Ultrasound 

## 2018-01-03 NOTE — Anesthesia Procedure Notes (Signed)
Procedure Name: Intubation Date/Time: 01/03/2018 1:07 PM Performed by: Julian ReilWelty, Jaslyn Bansal F, CRNA Pre-anesthesia Checklist: Patient identified, Emergency Drugs available, Suction available, Patient being monitored and Timeout performed Patient Re-evaluated:Patient Re-evaluated prior to induction Oxygen Delivery Method: Circle system utilized Preoxygenation: Pre-oxygenation with 100% oxygen Induction Type: IV induction Ventilation: Mask ventilation without difficulty Laryngoscope Size: Miller and 3 Grade View: Grade I Tube type: Oral Tube size: 7.5 mm Number of attempts: 1 Airway Equipment and Method: Stylet Placement Confirmation: ETT inserted through vocal cords under direct vision,  positive ETCO2 and breath sounds checked- equal and bilateral Secured at: 24 cm Tube secured with: Tape Dental Injury: Teeth and Oropharynx as per pre-operative assessment  Comments: 4x4s bite block used.

## 2018-01-03 NOTE — Anesthesia Postprocedure Evaluation (Signed)
Anesthesia Post Note  Patient: Cephas DarbyChristopher W Cobb  Procedure(s) Performed: LAPAROSCOPIC APPENDECTOMY (N/A Abdomen)     Patient location during evaluation: PACU Anesthesia Type: General Level of consciousness: awake and alert Pain management: pain level controlled Vital Signs Assessment: post-procedure vital signs reviewed and stable Respiratory status: spontaneous breathing, nonlabored ventilation, respiratory function stable and patient connected to nasal cannula oxygen Cardiovascular status: blood pressure returned to baseline and stable Postop Assessment: no apparent nausea or vomiting Anesthetic complications: no    Last Vitals:  Vitals:   01/03/18 1445 01/03/18 1453  BP:  (!) 150/70  Pulse: 64 68  Resp: 14 18  Temp:  36.7 C  SpO2: 96% 97%    Last Pain:  Vitals:   01/03/18 1513  TempSrc:   PainSc: 8                  Chiamaka Latka EDWARD

## 2018-01-03 NOTE — Progress Notes (Signed)
Gave patient ERAS protocol medications including celebrex, tylenol, and gabapentin. Patient not given ERAS drink since time is too close to surgery.

## 2018-01-03 NOTE — ED Notes (Signed)
Pt given a urinal.  States he is unable to urinate at this time.

## 2018-01-03 NOTE — ED Provider Notes (Signed)
[facility] EMERGENCY DEPARTMENT Provider Note   CSN: [id number] Arrival date & time: 01/03/18  4098     History   Chief Complaint Chief Complaint  Patient presents with  . Abdominal Pain    HPI Eddie Vang is a 57 y.o. male with a past medical history of type 1 diabetes, who presents to ED for evaluation of constant, sharp right upper quadrant and right middle quadrant abdominal pain since yesterday afternoon.  States that symptoms began suddenly.  States that symptoms began as a "gas pain" throughout his entire middle abdomen but has now localized to the right upper quadrant.  Reports associated nausea but no vomiting.  Denies any diarrhea, constipation, dysuria or hematuria.  He also states that his blood sugars have been running high for the past 2 days.  He is usually at low 100s but his most recent checks have been 190s in the evening. Denies fever.  HPI  Past Medical History:  Diagnosis Date  . Diabetes mellitus     There are no active problems to display for this patient.   Past Surgical History:  Procedure Laterality Date  . TIBIA IM NAIL INSERTION  06/02/2012   Procedure: INTRAMEDULLARY (IM) NAIL TIBIAL;  Surgeon: Venita Lick, MD;  Location: [facility] OR;  Service: Orthopedics;  Laterality: Right;  with ORIF posterior malleolus fracture       Home Medications    Prior to Admission medications   Medication Sig Start Date End Date Taking? Authorizing Provider  atorvastatin (LIPITOR) 10 MG tablet Take 10 mg by mouth at bedtime.   Yes [provider]  cholecalciferol (VITAMIN D) 1000 UNITS tablet Take 1,000 Units by mouth daily.   Yes [provider]  HYDROcodone-homatropine (HYCODAN) 5-1.5 MG/5ML syrup Take 5 mLs by mouth every 6 (six) hours as needed for cough. 07/15/16  Yes Roxy Horseman, PA-C  insulin NPH (HUMULIN N,NOVOLIN N) 100 UNIT/ML injection Inject 5-8 Units into the skin 2 (two) times daily before a meal. Inject 8  units in the morning and 5 units at night.   Yes [provider]  insulin regular (NOVOLIN R,HUMULIN R) 100 units/mL injection Inject 5 Units into the skin 2 (two) times daily before a meal.   Yes [provider]  oxyCODONE-acetaminophen (PERCOCET/ROXICET) 5-325 MG per tablet Take 2 tablets by mouth every 4 (four) hours as needed for moderate pain or severe pain. 12/01/14  Yes TysingerLanora Manis, NP    Family History No family history on file.  Social History Social History   Tobacco Use  . Smoking status: Never Smoker  . Smokeless tobacco: Never Used  Substance Use Topics  . Alcohol use: No  . Drug use: No     Allergies   Patient has no known allergies.   Review of Systems Review of Systems  Constitutional: Negative for appetite change, chills and fever.  HENT: Negative for ear pain, rhinorrhea, sneezing and sore throat.   Eyes: Negative for photophobia and visual disturbance.  Respiratory: Negative for cough, chest tightness, shortness of breath and wheezing.   Cardiovascular: Negative for chest pain and palpitations.  Gastrointestinal: Positive for abdominal pain and nausea. Negative for blood in stool, constipation, diarrhea and vomiting.  Genitourinary: Negative for dysuria, hematuria and urgency.  Musculoskeletal: Negative for myalgias.  Skin: Negative for rash.  Neurological: Negative for dizziness, weakness and light-headedness.     Physical Exam Updated Vital Signs BP (!) 141/92 (BP Location: Right Arm)   Pulse (!) 110  Temp 97.9 F (36.6 C) (Oral)   Resp 20   SpO2 99%   Physical Exam  Constitutional: He appears well-developed and well-nourished. No distress.  Appears uncomfortable.  HENT:  Head: Normocephalic and atraumatic.  Nose: Nose normal.  Eyes: Conjunctivae and EOM are normal. Left eye exhibits no discharge. No scleral icterus.  Neck: Normal range of motion. Neck supple.  Cardiovascular: Normal rate, regular rhythm, normal  heart sounds and intact distal pulses. Exam reveals no gallop and no friction rub.  No murmur heard. Pulmonary/Chest: Effort normal and breath sounds normal. No respiratory distress.  Abdominal: Soft. Bowel sounds are normal. He exhibits no distension. There is tenderness. There is no guarding.    Musculoskeletal: Normal range of motion. He exhibits no edema.  Neurological: He is alert. He exhibits normal muscle tone. Coordination normal.  Skin: Skin is warm and dry. No rash noted.  Psychiatric: He has a normal mood and affect.  Nursing note and vitals reviewed.    ED Treatments / Results  Labs (all labs ordered are listed, but only abnormal results are displayed) Labs Reviewed  COMPREHENSIVE METABOLIC PANEL - Abnormal; Notable for the following components:      Result Value   Chloride 99 (*)    Glucose, Bld 172 (*)    Creatinine, Ser 1.33 (*)    AST 47 (*)    Total Bilirubin 1.4 (*)    GFR calc non Af Amer 58 (*)    All other components within normal limits  CBC - Abnormal; Notable for the following components:   WBC 15.1 (*)    All other components within normal limits  CBG MONITORING, ED - Abnormal; Notable for the following components:   Glucose-Capillary 162 (*)    All other components within normal limits  LIPASE, BLOOD  URINALYSIS, ROUTINE W REFLEX MICROSCOPIC    EKG  EKG Interpretation None       Radiology Ct Abdomen Pelvis W Contrast  Result Date: 01/03/2018 CLINICAL DATA:  Upper abdominal cramping for 1 day. EXAM: CT ABDOMEN AND PELVIS WITH CONTRAST TECHNIQUE: Multidetector CT imaging of the abdomen and pelvis was performed using the standard protocol following bolus administration of intravenous contrast. CONTRAST:  100 ml ISOVUE-300 IOPAMIDOL (ISOVUE-300) INJECTION 61% COMPARISON:  None. FINDINGS: Lower chest: Heart size is normal. No pleural or pericardial effusion. There is some dependent atelectasis in the lung bases. Coarse calcification projecting over  the right hemidiaphragm could be a calcified pleural plaque or large granuloma. Lung bases are otherwise clear. Hepatobiliary: The gallbladder and biliary tree appear normal. The liver is low attenuating consistent with fatty infiltration. Pancreas: Unremarkable. No pancreatic ductal dilatation or surrounding inflammatory changes. Spleen: Normal in size without focal abnormality. Adrenals/Urinary Tract: Nonobstructing renal stones are seen bilaterally. Dilated calyx in the upper pole the left kidney is incidentally noted. No hydronephrosis or ureteral stones. Urinary bladder is normal appearance. The adrenal glands appear normal. Stomach/Bowel: The appendix is markedly dilated with surrounding stranding consistent with acute appendicitis. There is an appendicoliths in the tip of the appendix measuring 0.9 cm in diameter. A second appendicoliths measuring approximately 1.8 cm long by 0.7 cm transverse is also seen in the distal appendix. The stomach, small bowel and colon appear normal. Vascular/Lymphatic: Aortic atherosclerosis. No enlarged abdominal or pelvic lymph nodes. Reproductive: Prostate is unremarkable. Other: No ascites. Musculoskeletal: Unilateral L5 pars articularis defect on the left is noted. No listhesis. The patient also has a well-marginated lesion in the left femoral neck with an appearance  most consistent with an enchondroma. IMPRESSION: The exam is positive for acute appendicitis. Two appendicoliths in the distal appendix are noted. Fatty infiltration of the liver. Nonobstructing bilateral renal stones. Atherosclerosis. 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 These results were called by telephone at the time of interpretation on 01/03/2018 at 10:56 am to [facility] who verbally acknowledged these results. Electronically Signed   By: Drusilla Kanner M.D.   On: 01/03/2018 11:00   US Abdomen Limited  Result Date: 01/03/2018 CLINICAL DATA:   57 year old male with history of right upper quadrant abdominal pain for the past 2 days. EXAM: ULTRASOUND ABDOMEN LIMITED RIGHT UPPER QUADRANT COMPARISON:  No priors. FINDINGS: Gallbladder: No gallstones or wall thickening visualized. No sonographic Murphy sign noted by sonographer. Common bile duct: Diameter: 4 mm Liver: No focal lesion identified. Diffusely increased echogenicity in the hepatic parenchyma, suggesting a background of hepatic steatosis. Portal vein is patent on color Doppler imaging with normal direction of blood flow towards the liver. IMPRESSION: 1. No acute findings to account for the patient's symptoms. Specifically, no evidence of cholelithiasis or findings to suggest an acute cholecystitis at this time. 2. Mild diffuse increased echogenicity throughout the hepatic parenchyma, concerning for a background of hepatic steatosis. Electronically Signed   By: Trudie Reed M.D.   On: 01/03/2018 07:58    Procedures Procedures (including critical care time)  Medications Ordered in ED Medications  morphine 4 MG/ML injection 2 mg (not administered)  morphine 4 MG/ML injection 2 mg (2 mg Intravenous Given 01/03/18 0849)  iopamidol (ISOVUE-300) 61 % injection (100 mLs  Contrast Given 01/03/18 1040)     Initial Impression / Assessment and Plan / ED Course  I have reviewed the triage vital signs and the nursing notes.  Pertinent labs & imaging results that were available during my care of the patient were reviewed by me and considered in my medical decision making (see chart for details).     Patient presents to ED for RUQ, RMQ abdominal pain since yesterday.  He does have associated nausea.  He is afebrile. CBG normal. Tenderness to palpation as indicated in the image.  CBC shows leukocytosis at 15.  CT positive for appendicitis.  Will consult general surgery for evaluation.  Portions of this note were generated with Scientist, clinical (histocompatibility and immunogenetics). Dictation errors may occur despite  best attempts at proofreading.   Final Clinical Impressions(s) / ED Diagnoses   Final diagnoses:  Acute appendicitis, unspecified acute appendicitis type    ED Discharge Orders    None       Dietrich Pates, PA-C 01/03/18 1104    Zadie Rhine, MD 01/03/18 1550

## 2018-01-03 NOTE — ED Notes (Signed)
Pt aware of need for urine sample, pt unable to provide urine at this time.  

## 2018-01-04 ENCOUNTER — Other Ambulatory Visit: Payer: Self-pay

## 2018-01-04 ENCOUNTER — Encounter (HOSPITAL_COMMUNITY): Payer: Self-pay | Admitting: General Surgery

## 2018-01-04 LAB — HIV ANTIBODY (ROUTINE TESTING W REFLEX): HIV Screen 4th Generation wRfx: NONREACTIVE

## 2018-01-04 LAB — BASIC METABOLIC PANEL
Anion gap: 11 (ref 5–15)
BUN: 11 mg/dL (ref 6–20)
CHLORIDE: 101 mmol/L (ref 101–111)
CO2: 23 mmol/L (ref 22–32)
Calcium: 8.8 mg/dL — ABNORMAL LOW (ref 8.9–10.3)
Creatinine, Ser: 1.07 mg/dL (ref 0.61–1.24)
GFR calc non Af Amer: >60 mL/min (ref >60)
Glucose, Bld: 163 mg/dL — ABNORMAL HIGH (ref 65–99)
Potassium: 5 mmol/L (ref 3.5–5.1)
Sodium: 135 mmol/L (ref 135–145)

## 2018-01-04 LAB — CBC
HCT: 38.9 % — ABNORMAL LOW (ref 39.0–52.0)
HEMOGLOBIN: 12.9 g/dL — AB (ref 13.0–17.0)
MCH: 32.7 pg (ref 26.0–34.0)
MCHC: 33.2 g/dL (ref 30.0–36.0)
MCV: 98.5 fL (ref 78.0–100.0)
Platelets: 187 10*3/uL (ref 150–400)
RBC: 3.95 MIL/uL — AB (ref 4.22–5.81)
RDW: 13.9 % (ref 11.5–15.5)
WBC: 14.2 10*3/uL — ABNORMAL HIGH (ref 4.0–10.5)

## 2018-01-04 LAB — GLUCOSE, CAPILLARY: Glucose-Capillary: 129 mg/dL — ABNORMAL HIGH (ref 65–99)

## 2018-01-04 MED ORDER — HYDROCODONE-ACETAMINOPHEN 5-325 MG PO TABS: 1.0000 | ORAL_TABLET | ORAL | 0 refills | Status: AC | PRN

## 2018-01-04 NOTE — Discharge Summary (Signed)
[facility] Discharge Summary   Patient ID: Eddie DarbyChristopher W Vang MRN: [id number] DOB/AGE: 1961-09-28 57 y.o.  Admit date: 01/03/2018 Discharge date: 01/04/2018  Admitting Diagnosis: appendicitis  Discharge Diagnosis Patient Active Problem List   Diagnosis Date Noted  . Acute appendicitis 01/03/2018    Consultants none  Imaging: Ct Abdomen Pelvis W Contrast  Result Date: 01/03/2018 CLINICAL DATA:  Upper abdominal cramping for 1 day. EXAM: CT ABDOMEN AND PELVIS WITH CONTRAST TECHNIQUE: Multidetector CT imaging of the abdomen and pelvis was performed using the standard protocol following bolus administration of intravenous contrast. CONTRAST:  100 ml ISOVUE-300 IOPAMIDOL (ISOVUE-300) INJECTION 61% COMPARISON:  None. FINDINGS: Lower chest: Heart size is normal. No pleural or pericardial effusion. There is some dependent atelectasis in the lung bases. Coarse calcification projecting over the right hemidiaphragm could be a calcified pleural plaque or large granuloma. Lung bases are otherwise clear. Hepatobiliary: The gallbladder and biliary tree appear normal. The liver is low attenuating consistent with fatty infiltration. Pancreas: Unremarkable. No pancreatic ductal dilatation or surrounding inflammatory changes. Spleen: Normal in size without focal abnormality. Adrenals/Urinary Tract: Nonobstructing renal stones are seen bilaterally. Dilated calyx in the upper pole the left kidney is incidentally noted. No hydronephrosis or ureteral stones. Urinary bladder is normal appearance. The adrenal glands appear normal. Stomach/Bowel: The appendix is markedly dilated with surrounding stranding consistent with acute appendicitis. There is an appendicoliths in the tip of the appendix measuring 0.9 cm in diameter. A second appendicoliths measuring approximately 1.8 cm long by 0.7 cm transverse is also seen in the distal appendix. The stomach, small bowel and colon appear normal.  Vascular/Lymphatic: Aortic atherosclerosis. No enlarged abdominal or pelvic lymph nodes. Reproductive: Prostate is unremarkable. Other: No ascites. Musculoskeletal: Unilateral L5 pars articularis defect on the left is noted. No listhesis. The patient also has a well-marginated lesion in the left femoral neck with an appearance most consistent with an enchondroma. IMPRESSION: The exam is positive for acute appendicitis. Two appendicoliths in the distal appendix are noted. Fatty infiltration of the liver. Nonobstructing bilateral renal stones. Atherosclerosis. 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 These results were called by telephone at the time of interpretation on 01/03/2018 at 10:56 am to [facility] who verbally acknowledged these results. Electronically Signed   By: Drusilla Kannerhomas  Dalessio M.D.   On: 01/03/2018 11:00   [place] Abdomen Limited  Result Date: 01/03/2018 CLINICAL DATA:  57 year old male with history of right upper quadrant abdominal pain for the past 2 days. EXAM: ULTRASOUND ABDOMEN LIMITED RIGHT UPPER QUADRANT COMPARISON:  No priors. FINDINGS: Gallbladder: No gallstones or wall thickening visualized. No sonographic Murphy sign noted by sonographer. Common bile duct: Diameter: 4 mm Liver: No focal lesion identified. Diffusely increased echogenicity in the hepatic parenchyma, suggesting a background of hepatic steatosis. Portal vein is patent on color Doppler imaging with normal direction of blood flow towards the liver. IMPRESSION: 1. No acute findings to account for the patient's symptoms. Specifically, no evidence of cholelithiasis or findings to suggest an acute cholecystitis at this time. 2. Mild diffuse increased echogenicity throughout the hepatic parenchyma, concerning for a background of hepatic steatosis. Electronically Signed   By: Trudie Reedaniel  Entrikin M.D.   On: 01/03/2018 07:58    Procedures Dr. Sheliah HatchKinsinger (01/03/18) - Laparoscopic  Appendectomy  HPI: Patient is a 57 y/o male [facility] sided abdominal pain since yesterday around 1400. Pain is sharp, constant and has progressively worsened. Patient states pain is  in mid to low right abdomen and does not radiate. Denies fevers/chills, n/v, diarrhea, constipation, urinary symptoms. No chest pain, SOB. Last BM yesterday afternoon, last meal yesterday. Patient PMH significant for type 1 diabetes well controlled on insulin. Surgical hx includes open bilateral inguinal hernia repair with mesh. No blood thinning medications. No allergies to antibiotics. Patient denies alcohol, illicit drugs, and tobacco use.   Hospital Course:  Workup showed appendicitis.  Patient was admitted and underwent procedure listed above.  Tolerated procedure well and was transferred to the floor.  Diet was advanced as tolerated.  On POD#1, the patient was voiding well, tolerating diet, ambulating well, pain well controlled, vital signs stable, incisions c/d/i and felt stable for discharge home. He denied fever, chills, nausea, vomiting, CP, SOB, pain or swelling in b/l calves. No issues overnight. Wife at bedside. Patient will follow up in our office in 2 weeks and knows to call with questions or concerns.  He will call to confirm appointment date/time.    Patient was discharged in good condition.  The [place] Substance controlled database was reviewed prior to prescribing narcotic pain medication to this patient.  Physical Exam: General:  Alert, NAD, pleasant, cooperative Cardio: RRR, S1 & S2 normal, no murmur, rubs, gallops Resp: Effort normal, lungs CTA bilaterally, no wheezes, rales, rhonchi Abd:  Soft, ND, normal bowel sounds, mild generalized TTP without guarding, incisions with glue intact and without drainage  Skin: warm and dry, no rashes noted  Allergies as of 01/04/2018      Reactions   Lantus [insulin Glargine] Other (See Comments)   Blood sugar crashed and body  cannot handle it      Medication List    STOP taking these medications   HYDROcodone-homatropine 5-1.5 MG/5ML syrup Commonly known as:  HYCODAN   oxyCODONE-acetaminophen 5-325 MG tablet Commonly known as:  PERCOCET/ROXICET     TAKE these medications   atorvastatin 10 MG tablet Commonly known as:  LIPITOR Take 10 mg by mouth at bedtime.   cholecalciferol 1000 units tablet Commonly known as:  VITAMIN D Take 1,000 Units by mouth daily.   HYDROcodone-acetaminophen 5-325 MG tablet Commonly known as:  NORCO/VICODIN Take 1 tablet by mouth every 4 (four) hours as needed for moderate pain.   insulin NPH Human 100 UNIT/ML injection Commonly known as:  HUMULIN N,NOVOLIN N Inject 5-8 Units into the skin 2 (two) times daily before a meal. Inject 8 units in the morning and 5 units at night.   insulin regular 100 units/mL injection Commonly known as:  NOVOLIN R,HUMULIN R Inject 5 Units into the skin 2 (two) times daily before a meal.        Follow-up Information    [facility] Call in 2 week(s).   Specialty:  General Surgery Why:  We are working on your appointment, please call to confirm. Please arrive 30 minutes prior to your appointment to check in and fill out paperwork. Bring photo ID and insurance information. Contact information: [address] [place] [address] [phone]          Signed: Joyce Copa [facility] 01/04/2018, 8:44 AM Pager: [phone] Consults: [phone] Mon-Fri 7:00 am-4:30 pm Sat-Sun 7:00 am-11:30 am

## 2018-01-04 NOTE — Discharge Planning (Signed)
Patient discharged home in stable condition. Verbalizes understanding of all discharge instructions, including home medications and follow up appointments. 

## 2018-12-03 ENCOUNTER — Emergency Department (HOSPITAL_COMMUNITY)
Admission: EM | Admit: 2018-12-03 | Discharge: 2018-12-04 | Disposition: A | Discharge status: Home / Self Care | Payer: Managed Care, Other (non HMO) | Attending: Emergency Medicine | Admitting: Emergency Medicine

## 2018-12-03 DIAGNOSIS — R109 Unspecified abdominal pain: Secondary | ICD-10-CM | POA: Insufficient documentation

## 2018-12-03 DIAGNOSIS — Z5321 Procedure and treatment not carried out due to patient leaving prior to being seen by health care provider: Secondary | ICD-10-CM | POA: Diagnosis not present

## 2018-12-04 LAB — COMPREHENSIVE METABOLIC PANEL
ALBUMIN: 5 g/dL (ref 3.5–5.0)
ALT: 35 U/L (ref 0–44)
AST: 46 U/L — ABNORMAL HIGH (ref 15–41)
Alkaline Phosphatase: 59 U/L (ref 38–126)
Anion gap: 13 (ref 5–15)
BUN: 10 mg/dL (ref 6–20)
CALCIUM: 9.9 mg/dL (ref 8.9–10.3)
CHLORIDE: 103 mmol/L (ref 98–111)
CO2: 27 mmol/L (ref 22–32)
CREATININE: 0.89 mg/dL (ref 0.61–1.24)
GFR calc Af Amer: >60 mL/min (ref >60)
GFR calc non Af Amer: >60 mL/min (ref >60)
GLUCOSE: 50 mg/dL — AB (ref 70–99)
Potassium: 4.1 mmol/L (ref 3.5–5.1)
Sodium: 143 mmol/L (ref 135–145)
Total Bilirubin: 0.6 mg/dL (ref 0.3–1.2)
Total Protein: 8.9 g/dL — ABNORMAL HIGH (ref 6.5–8.1)

## 2018-12-04 LAB — CBC
HEMATOCRIT: 50.2 % (ref 39.0–52.0)
Hemoglobin: 15.9 g/dL (ref 13.0–17.0)
MCH: 31 pg (ref 26.0–34.0)
MCHC: 31.7 g/dL (ref 30.0–36.0)
MCV: 97.9 fL (ref 80.0–100.0)
PLATELETS: 267 10*3/uL (ref 150–400)
RBC: 5.13 MIL/uL (ref 4.22–5.81)
RDW: 13.7 % (ref 11.5–15.5)
WBC: 15.6 10*3/uL — ABNORMAL HIGH (ref 4.0–10.5)
nRBC: 0 % (ref 0.0–0.2)

## 2018-12-04 LAB — URINALYSIS, ROUTINE W REFLEX MICROSCOPIC
BACTERIA UA: NONE SEEN
Bilirubin Urine: NEGATIVE
Glucose, UA: NEGATIVE mg/dL
Ketones, ur: 5 mg/dL — AB
Leukocytes, UA: NEGATIVE
Nitrite: NEGATIVE
PH: 5 (ref 5.0–8.0)
Protein, ur: NEGATIVE mg/dL
SPECIFIC GRAVITY, URINE: 1.014 (ref 1.005–1.030)

## 2018-12-04 LAB — LIPASE, BLOOD: Lipase: 22 U/L (ref 11–51)

## 2018-12-04 NOTE — ED Triage Notes (Signed)
Patient went out to eat with his wife approximately 8 hours ago, they had different meals but shared the same salad. Currently experiencing vomitting and diarrhea. Patient also is type 1 diabetic, pt checked BG on personal machine, read 43, pt ingested personal candy and was given OJ. Declined any other intervention for BG from staff.

## 2018-12-04 NOTE — ED Notes (Signed)
Pt states that they are going home to rest, they no longer want to wait.
